# Patient Record
Sex: Female | Born: 1965 | Race: Black or African American | Hispanic: No | Marital: Single | Smoking: Current every day smoker
Health system: Southern US, Community
[De-identification: ages and names within clinical notes are randomized; demographics above are authoritative.]

## PROBLEM LIST (undated history)

## (undated) DIAGNOSIS — X838XXA Intentional self-harm by other specified means, initial encounter: Secondary | ICD-10-CM

## (undated) DIAGNOSIS — S069X9A Unspecified intracranial injury with loss of consciousness of unspecified duration, initial encounter: Secondary | ICD-10-CM

## (undated) DIAGNOSIS — F1910 Other psychoactive substance abuse, uncomplicated: Secondary | ICD-10-CM

## (undated) DIAGNOSIS — Z21 Asymptomatic human immunodeficiency virus [HIV] infection status: Secondary | ICD-10-CM

## (undated) DIAGNOSIS — F319 Bipolar disorder, unspecified: Secondary | ICD-10-CM

## (undated) DIAGNOSIS — S069XAA Unspecified intracranial injury with loss of consciousness status unknown, initial encounter: Secondary | ICD-10-CM

## (undated) DIAGNOSIS — B20 Human immunodeficiency virus [HIV] disease: Secondary | ICD-10-CM

## (undated) DIAGNOSIS — I10 Essential (primary) hypertension: Secondary | ICD-10-CM

## (undated) HISTORY — PX: OTHER SURGICAL HISTORY: SHX169

## (undated) HISTORY — PX: ARM AMPUTATION AT ELBOW: SHX550

---

## 2004-03-02 ENCOUNTER — Inpatient Hospital Stay (HOSPITAL_COMMUNITY): Admission: AD | Admit: 2004-03-02 | Discharge: 2004-03-14 | Payer: Self-pay | Admitting: Psychiatry

## 2004-03-02 ENCOUNTER — Ambulatory Visit: Payer: Self-pay | Admitting: Psychiatry

## 2004-06-16 ENCOUNTER — Ambulatory Visit: Payer: Self-pay | Admitting: Psychiatry

## 2004-06-16 ENCOUNTER — Inpatient Hospital Stay (HOSPITAL_COMMUNITY): Admission: RE | Admit: 2004-06-16 | Discharge: 2004-06-23 | Payer: Self-pay | Admitting: Psychiatry

## 2005-08-29 ENCOUNTER — Ambulatory Visit: Payer: Self-pay | Admitting: Psychiatry

## 2005-08-29 ENCOUNTER — Inpatient Hospital Stay (HOSPITAL_COMMUNITY): Admission: AD | Admit: 2005-08-29 | Discharge: 2005-09-09 | Payer: Self-pay | Admitting: Psychiatry

## 2005-10-09 ENCOUNTER — Emergency Department (HOSPITAL_COMMUNITY): Admission: EM | Admit: 2005-10-09 | Discharge: 2005-10-09 | Payer: Self-pay | Admitting: *Deleted

## 2005-10-28 ENCOUNTER — Ambulatory Visit: Payer: Self-pay | Admitting: *Deleted

## 2005-10-28 ENCOUNTER — Inpatient Hospital Stay (HOSPITAL_COMMUNITY): Admission: AD | Admit: 2005-10-28 | Discharge: 2005-11-01 | Payer: Self-pay | Admitting: *Deleted

## 2006-05-06 ENCOUNTER — Ambulatory Visit: Payer: Self-pay | Admitting: Internal Medicine

## 2006-05-06 ENCOUNTER — Encounter (INDEPENDENT_AMBULATORY_CARE_PROVIDER_SITE_OTHER): Payer: Self-pay | Admitting: *Deleted

## 2006-05-06 ENCOUNTER — Encounter: Admission: RE | Admit: 2006-05-06 | Discharge: 2006-05-06 | Payer: Self-pay | Admitting: Internal Medicine

## 2006-05-06 DIAGNOSIS — F3189 Other bipolar disorder: Secondary | ICD-10-CM | POA: Insufficient documentation

## 2006-05-06 DIAGNOSIS — F258 Other schizoaffective disorders: Secondary | ICD-10-CM

## 2006-05-06 DIAGNOSIS — IMO0002 Reserved for concepts with insufficient information to code with codable children: Secondary | ICD-10-CM | POA: Insufficient documentation

## 2006-05-06 DIAGNOSIS — B029 Zoster without complications: Secondary | ICD-10-CM | POA: Insufficient documentation

## 2006-05-06 DIAGNOSIS — E119 Type 2 diabetes mellitus without complications: Secondary | ICD-10-CM | POA: Insufficient documentation

## 2006-05-06 DIAGNOSIS — B20 Human immunodeficiency virus [HIV] disease: Secondary | ICD-10-CM | POA: Insufficient documentation

## 2006-05-06 LAB — CONVERTED CEMR LAB
ALT: 16 units/L (ref 0–35)
AST: 20 units/L (ref 0–37)
Alkaline Phosphatase: 79 units/L (ref 39–117)
BUN: 8 mg/dL (ref 6–23)
Basophils Absolute: 0 10*3/uL (ref 0.0–0.1)
Basophils Relative: 0 % (ref 0–1)
Bilirubin Urine: NEGATIVE
CD4 T Cell Abs: 1250
Chloride: 101 meq/L (ref 96–112)
Creatinine, Ser: 0.87 mg/dL (ref 0.40–1.20)
Eosinophils Absolute: 0.2 10*3/uL (ref 0.0–0.7)
Eosinophils Relative: 2 % (ref 0–5)
GC Probe Amp, Urine: NEGATIVE
HCT: 40.7 % (ref 36.0–46.0)
HCV Ab: NEGATIVE
HCV Ab: NEGATIVE
HIV 1 RNA Quant: 1330 copies/mL — ABNORMAL HIGH (ref <50)
HIV-1 antibody: POSITIVE — AB
HIV-2 Ab: UNDETERMINED — AB
HIV: REACTIVE
Hep B S Ab: POSITIVE
Hep B S Ab: POSITIVE — AB
Lymphocytes Relative: 46 % (ref 12–46)
MCHC: 33.7 g/dL (ref 30.0–36.0)
Monocytes Absolute: 0.6 10*3/uL (ref 0.2–0.7)
Nitrite: NEGATIVE
Platelets: 362 10*3/uL (ref 150–400)
RDW: 14.7 % — ABNORMAL HIGH (ref 11.5–14.0)
Specific Gravity, Urine: 1.028 (ref 1.005–1.03)
Total Bilirubin: 0.5 mg/dL (ref 0.3–1.2)
Urobilinogen, UA: 0.2 (ref 0.0–1.0)

## 2006-05-24 ENCOUNTER — Telehealth: Payer: Self-pay | Admitting: Internal Medicine

## 2006-05-25 ENCOUNTER — Telehealth: Payer: Self-pay | Admitting: Internal Medicine

## 2006-06-01 ENCOUNTER — Ambulatory Visit: Payer: Self-pay | Admitting: Internal Medicine

## 2006-06-01 LAB — CONVERTED CEMR LAB: Pap Smear: NORMAL

## 2006-07-12 ENCOUNTER — Emergency Department (HOSPITAL_COMMUNITY): Admission: EM | Admit: 2006-07-12 | Discharge: 2006-07-12 | Payer: Self-pay | Admitting: Emergency Medicine

## 2006-08-03 ENCOUNTER — Telehealth: Payer: Self-pay | Admitting: Internal Medicine

## 2006-08-06 ENCOUNTER — Encounter (INDEPENDENT_AMBULATORY_CARE_PROVIDER_SITE_OTHER): Payer: Self-pay | Admitting: *Deleted

## 2006-08-06 ENCOUNTER — Ambulatory Visit: Payer: Self-pay | Admitting: Internal Medicine

## 2006-08-06 DIAGNOSIS — R197 Diarrhea, unspecified: Secondary | ICD-10-CM

## 2006-08-06 DIAGNOSIS — M79609 Pain in unspecified limb: Secondary | ICD-10-CM

## 2006-08-06 LAB — CONVERTED CEMR LAB: Blood Glucose, Fingerstick: 180

## 2006-08-10 ENCOUNTER — Encounter: Admission: RE | Admit: 2006-08-10 | Discharge: 2006-08-10 | Payer: Self-pay | Admitting: Internal Medicine

## 2006-08-10 ENCOUNTER — Ambulatory Visit: Payer: Self-pay | Admitting: Internal Medicine

## 2006-08-10 LAB — CONVERTED CEMR LAB
ALT: 13 units/L (ref 0–35)
AST: 14 units/L (ref 0–37)
Albumin: 3.9 g/dL (ref 3.5–5.2)
Basophils Absolute: 0 10*3/uL (ref 0.0–0.1)
Basophils Relative: 0 % (ref 0–1)
CO2: 25 meq/L (ref 19–32)
Calcium: 9.2 mg/dL (ref 8.4–10.5)
Chloride: 103 meq/L (ref 96–112)
HIV 1 RNA Quant: 3330 copies/mL — ABNORMAL HIGH (ref <50)
Hemoglobin: 12.3 g/dL (ref 12.0–15.0)
Lymphocytes Relative: 45 % (ref 12–46)
MCHC: 33.2 g/dL (ref 30.0–36.0)
Monocytes Relative: 8 % (ref 3–11)
Neutro Abs: 2.8 10*3/uL (ref 1.7–7.7)
Neutrophils Relative %: 43 % (ref 43–77)
Potassium: 4.2 meq/L (ref 3.5–5.3)
RBC: 4.42 M/uL (ref 3.87–5.11)
RDW: 14.3 % — ABNORMAL HIGH (ref 11.5–14.0)
Total Protein: 6.9 g/dL (ref 6.0–8.3)

## 2006-08-11 ENCOUNTER — Encounter: Payer: Self-pay | Admitting: Internal Medicine

## 2006-08-23 ENCOUNTER — Encounter: Payer: Self-pay | Admitting: Internal Medicine

## 2006-08-24 ENCOUNTER — Ambulatory Visit: Payer: Self-pay | Admitting: Internal Medicine

## 2006-08-24 DIAGNOSIS — N951 Menopausal and female climacteric states: Secondary | ICD-10-CM | POA: Insufficient documentation

## 2006-08-31 ENCOUNTER — Telehealth: Payer: Self-pay

## 2006-09-06 ENCOUNTER — Encounter: Admission: RE | Admit: 2006-09-06 | Discharge: 2006-12-05 | Payer: Self-pay | Admitting: Internal Medicine

## 2006-09-06 ENCOUNTER — Encounter: Payer: Self-pay | Admitting: Internal Medicine

## 2006-10-14 ENCOUNTER — Other Ambulatory Visit: Payer: Self-pay

## 2006-10-14 ENCOUNTER — Other Ambulatory Visit: Payer: Self-pay | Admitting: Emergency Medicine

## 2006-10-14 ENCOUNTER — Inpatient Hospital Stay (HOSPITAL_COMMUNITY): Admission: AD | Admit: 2006-10-14 | Discharge: 2006-10-18 | Payer: Self-pay | Admitting: Psychiatry

## 2006-10-14 ENCOUNTER — Ambulatory Visit: Payer: Self-pay | Admitting: Psychiatry

## 2006-10-26 ENCOUNTER — Emergency Department (HOSPITAL_COMMUNITY): Admission: EM | Admit: 2006-10-26 | Discharge: 2006-10-26 | Payer: Self-pay | Admitting: Emergency Medicine

## 2006-11-09 ENCOUNTER — Encounter: Payer: Self-pay | Admitting: Internal Medicine

## 2006-11-23 ENCOUNTER — Emergency Department (HOSPITAL_COMMUNITY): Admission: EM | Admit: 2006-11-23 | Discharge: 2006-11-23 | Payer: Self-pay | Admitting: Emergency Medicine

## 2006-11-30 ENCOUNTER — Emergency Department (HOSPITAL_COMMUNITY): Admission: EM | Admit: 2006-11-30 | Discharge: 2006-11-30 | Payer: Self-pay | Admitting: Family Medicine

## 2006-12-05 ENCOUNTER — Inpatient Hospital Stay (HOSPITAL_COMMUNITY): Admission: EM | Admit: 2006-12-05 | Discharge: 2006-12-08 | Payer: Self-pay | Admitting: Emergency Medicine

## 2006-12-08 ENCOUNTER — Inpatient Hospital Stay (HOSPITAL_COMMUNITY): Admission: RE | Admit: 2006-12-08 | Discharge: 2006-12-14 | Payer: Self-pay | Admitting: Psychiatry

## 2006-12-08 ENCOUNTER — Ambulatory Visit: Payer: Self-pay | Admitting: Psychiatry

## 2007-01-11 ENCOUNTER — Encounter (INDEPENDENT_AMBULATORY_CARE_PROVIDER_SITE_OTHER): Payer: Self-pay | Admitting: *Deleted

## 2007-01-24 ENCOUNTER — Encounter: Payer: Self-pay | Admitting: Internal Medicine

## 2007-01-25 ENCOUNTER — Telehealth: Payer: Self-pay | Admitting: Internal Medicine

## 2007-01-26 ENCOUNTER — Encounter (INDEPENDENT_AMBULATORY_CARE_PROVIDER_SITE_OTHER): Payer: Self-pay | Admitting: *Deleted

## 2007-02-03 ENCOUNTER — Ambulatory Visit: Payer: Self-pay | Admitting: Internal Medicine

## 2007-02-03 ENCOUNTER — Encounter: Admission: RE | Admit: 2007-02-03 | Discharge: 2007-02-03 | Payer: Self-pay | Admitting: Internal Medicine

## 2007-02-03 LAB — CONVERTED CEMR LAB
ALT: 15 units/L (ref 0–35)
Alkaline Phosphatase: 92 units/L (ref 39–117)
Basophils Absolute: 0 10*3/uL (ref 0.0–0.1)
CO2: 21 meq/L (ref 19–32)
Eosinophils Relative: 2 % (ref 0–5)
HCT: 39.7 % (ref 36.0–46.0)
Lymphocytes Relative: 49 % — ABNORMAL HIGH (ref 12–46)
Platelets: 359 10*3/uL (ref 150–400)
RDW: 15.7 % — ABNORMAL HIGH (ref 11.5–15.5)
Sodium: 138 meq/L (ref 135–145)
Total Bilirubin: 0.3 mg/dL (ref 0.3–1.2)
Total Protein: 7.4 g/dL (ref 6.0–8.3)

## 2007-02-18 ENCOUNTER — Ambulatory Visit: Payer: Self-pay | Admitting: Internal Medicine

## 2007-04-26 ENCOUNTER — Telehealth: Payer: Self-pay | Admitting: Internal Medicine

## 2007-05-27 ENCOUNTER — Ambulatory Visit: Payer: Self-pay | Admitting: Internal Medicine

## 2007-05-27 ENCOUNTER — Encounter: Admission: RE | Admit: 2007-05-27 | Discharge: 2007-05-27 | Payer: Self-pay | Admitting: Internal Medicine

## 2007-05-27 LAB — CONVERTED CEMR LAB
AST: 15 units/L (ref 0–37)
Alkaline Phosphatase: 89 units/L (ref 39–117)
BUN: 12 mg/dL (ref 6–23)
Basophils Relative: 0 % (ref 0–1)
Creatinine, Ser: 0.96 mg/dL (ref 0.40–1.20)
Eosinophils Absolute: 0.2 10*3/uL (ref 0.0–0.7)
Eosinophils Relative: 2 % (ref 0–5)
HCT: 38 % (ref 36.0–46.0)
Hemoglobin: 13.2 g/dL (ref 12.0–15.0)
MCHC: 34.7 g/dL (ref 30.0–36.0)
MCV: 79.8 fL (ref 78.0–100.0)
Monocytes Absolute: 0.7 10*3/uL (ref 0.1–1.0)
Monocytes Relative: 8 % (ref 3–12)
RBC: 4.76 M/uL (ref 3.87–5.11)

## 2007-06-08 ENCOUNTER — Ambulatory Visit: Payer: Self-pay | Admitting: Internal Medicine

## 2007-06-08 DIAGNOSIS — R1115 Cyclical vomiting syndrome unrelated to migraine: Secondary | ICD-10-CM | POA: Insufficient documentation

## 2007-07-04 ENCOUNTER — Telehealth (INDEPENDENT_AMBULATORY_CARE_PROVIDER_SITE_OTHER): Payer: Self-pay | Admitting: *Deleted

## 2007-07-04 ENCOUNTER — Encounter: Payer: Self-pay | Admitting: Internal Medicine

## 2007-07-22 ENCOUNTER — Emergency Department (HOSPITAL_COMMUNITY): Admission: EM | Admit: 2007-07-22 | Discharge: 2007-07-23 | Payer: Self-pay | Admitting: Emergency Medicine

## 2007-10-25 ENCOUNTER — Encounter: Payer: Self-pay | Admitting: Internal Medicine

## 2007-10-25 ENCOUNTER — Ambulatory Visit: Payer: Self-pay | Admitting: Internal Medicine

## 2007-10-25 LAB — CONVERTED CEMR LAB
Albumin: 3.5 g/dL (ref 3.5–5.2)
Alkaline Phosphatase: 104 units/L (ref 39–117)
BUN: 7 mg/dL (ref 6–23)
CO2: 23 meq/L (ref 19–32)
Calcium: 9 mg/dL (ref 8.4–10.5)
Eosinophils Absolute: 0.2 10*3/uL (ref 0.0–0.7)
Glucose, Bld: 155 mg/dL — ABNORMAL HIGH (ref 70–99)
HCT: 37.4 % (ref 36.0–46.0)
HIV 1 RNA Quant: 2080 copies/mL — ABNORMAL HIGH (ref <50)
HIV-1 RNA Quant, Log: 3.32 — ABNORMAL HIGH (ref <1.70)
Lymphocytes Relative: 37 % (ref 12–46)
Lymphs Abs: 3.7 10*3/uL (ref 0.7–4.0)
MCV: 79.1 fL (ref 78.0–100.0)
Monocytes Relative: 4 % (ref 3–12)
Neutrophils Relative %: 57 % (ref 43–77)
Potassium: 3.7 meq/L (ref 3.5–5.3)
RBC: 4.73 M/uL (ref 3.87–5.11)
WBC: 10.4 10*3/uL (ref 4.0–10.5)

## 2007-11-04 ENCOUNTER — Ambulatory Visit (HOSPITAL_COMMUNITY): Admission: RE | Admit: 2007-11-04 | Discharge: 2007-11-04 | Payer: Self-pay | Admitting: Internal Medicine

## 2007-11-09 ENCOUNTER — Ambulatory Visit: Payer: Self-pay | Admitting: Internal Medicine

## 2007-11-09 DIAGNOSIS — S335XXA Sprain of ligaments of lumbar spine, initial encounter: Secondary | ICD-10-CM

## 2008-02-14 ENCOUNTER — Encounter: Payer: Self-pay | Admitting: Internal Medicine

## 2008-05-02 ENCOUNTER — Ambulatory Visit: Payer: Self-pay | Admitting: Internal Medicine

## 2008-05-02 LAB — CONVERTED CEMR LAB
ALT: 16 units/L (ref 0–35)
AST: 15 units/L (ref 0–37)
Alkaline Phosphatase: 107 units/L (ref 39–117)
Basophils Absolute: 0 10*3/uL (ref 0.0–0.1)
Basophils Relative: 0 % (ref 0–1)
CO2: 24 meq/L (ref 19–32)
Cholesterol: 186 mg/dL (ref 0–200)
Creatinine, Ser: 0.78 mg/dL (ref 0.40–1.20)
Eosinophils Relative: 3 % (ref 0–5)
HCT: 35.4 % — ABNORMAL LOW (ref 36.0–46.0)
HIV-1 RNA Quant, Log: 3.08 — ABNORMAL HIGH (ref <1.68)
Hemoglobin: 11.9 g/dL — ABNORMAL LOW (ref 12.0–15.0)
LDL Cholesterol: 113 mg/dL — ABNORMAL HIGH (ref 0–99)
MCHC: 33.6 g/dL (ref 30.0–36.0)
MCV: 78 fL (ref 78.0–100.0)
Monocytes Absolute: 0.6 10*3/uL (ref 0.1–1.0)
Monocytes Relative: 9 % (ref 3–12)
RBC: 4.54 M/uL (ref 3.87–5.11)
RDW: 16.8 % — ABNORMAL HIGH (ref 11.5–15.5)
Sodium: 138 meq/L (ref 135–145)
Total Bilirubin: 0.3 mg/dL (ref 0.3–1.2)
Total CHOL/HDL Ratio: 3.2
Total Protein: 7 g/dL (ref 6.0–8.3)
VLDL: 15 mg/dL (ref 0–40)

## 2008-05-18 ENCOUNTER — Ambulatory Visit: Payer: Self-pay | Admitting: Internal Medicine

## 2008-05-18 DIAGNOSIS — I10 Essential (primary) hypertension: Secondary | ICD-10-CM

## 2008-06-01 ENCOUNTER — Ambulatory Visit: Payer: Self-pay | Admitting: Internal Medicine

## 2008-06-01 ENCOUNTER — Encounter: Payer: Self-pay | Admitting: Internal Medicine

## 2008-06-03 ENCOUNTER — Emergency Department (HOSPITAL_COMMUNITY): Admission: EM | Admit: 2008-06-03 | Discharge: 2008-06-03 | Payer: Self-pay | Admitting: Emergency Medicine

## 2008-06-06 ENCOUNTER — Encounter: Payer: Self-pay | Admitting: Internal Medicine

## 2008-10-08 ENCOUNTER — Telehealth: Payer: Self-pay | Admitting: Internal Medicine

## 2008-10-09 ENCOUNTER — Ambulatory Visit: Payer: Self-pay | Admitting: Internal Medicine

## 2008-10-09 DIAGNOSIS — N898 Other specified noninflammatory disorders of vagina: Secondary | ICD-10-CM | POA: Insufficient documentation

## 2008-10-09 LAB — CONVERTED CEMR LAB
ALT: 20 units/L (ref 0–35)
Alkaline Phosphatase: 102 units/L (ref 39–117)
Basophils Relative: 0 % (ref 0–1)
CO2: 18 meq/L — ABNORMAL LOW (ref 19–32)
Creatinine, Ser: 0.86 mg/dL (ref 0.40–1.20)
Eosinophils Absolute: 0.2 10*3/uL (ref 0.0–0.7)
Eosinophils Relative: 2 % (ref 0–5)
Glucose, Bld: 116 mg/dL — ABNORMAL HIGH (ref 70–99)
HCT: 37.5 % (ref 36.0–46.0)
Lymphs Abs: 4.5 10*3/uL — ABNORMAL HIGH (ref 0.7–4.0)
MCHC: 33.9 g/dL (ref 30.0–36.0)
MCV: 75.6 fL — ABNORMAL LOW (ref >=78.0)
Monocytes Absolute: 1 10*3/uL (ref 0.1–1.0)
Monocytes Relative: 9 % (ref 3–12)
RBC: 4.96 M/uL (ref 3.87–5.11)
Sodium: 136 meq/L (ref 135–145)
Total Bilirubin: 0.4 mg/dL (ref 0.3–1.2)
Total Protein: 7.3 g/dL (ref 6.0–8.3)
WBC: 11.3 10*3/uL — ABNORMAL HIGH (ref 4.0–10.5)

## 2008-10-11 LAB — CONVERTED CEMR LAB
GC Probe Amp, Urine: NEGATIVE
Gardnerella vaginalis: POSITIVE — AB
Hemoglobin, Urine: NEGATIVE
Leukocytes, UA: NEGATIVE
Nitrite: NEGATIVE
Specific Gravity, Urine: 1.041 — ABNORMAL HIGH (ref 1.005–1.0)
Urine Glucose: NEGATIVE mg/dL
WBC, UA: NONE SEEN cells/hpf (ref <3)
pH: 6 (ref 5.0–8.0)

## 2008-10-23 ENCOUNTER — Ambulatory Visit: Payer: Self-pay | Admitting: Internal Medicine

## 2009-01-14 ENCOUNTER — Emergency Department (HOSPITAL_COMMUNITY): Admission: EM | Admit: 2009-01-14 | Discharge: 2009-01-14 | Payer: Self-pay | Admitting: Emergency Medicine

## 2009-01-18 ENCOUNTER — Emergency Department (HOSPITAL_COMMUNITY): Admission: EM | Admit: 2009-01-18 | Discharge: 2009-01-18 | Payer: Self-pay | Admitting: Emergency Medicine

## 2009-01-23 ENCOUNTER — Telehealth (INDEPENDENT_AMBULATORY_CARE_PROVIDER_SITE_OTHER): Payer: Self-pay | Admitting: *Deleted

## 2009-01-23 ENCOUNTER — Encounter: Payer: Self-pay | Admitting: Infectious Disease

## 2009-04-08 ENCOUNTER — Ambulatory Visit: Payer: Self-pay | Admitting: Internal Medicine

## 2009-04-08 LAB — CONVERTED CEMR LAB
ALT: 21 units/L (ref 0–35)
CO2: 23 meq/L (ref 19–32)
Calcium: 9.1 mg/dL (ref 8.4–10.5)
Chloride: 103 meq/L (ref 96–112)
Creatinine, Ser: 0.83 mg/dL (ref 0.40–1.20)
Eosinophils Absolute: 0.2 10*3/uL (ref 0.0–0.7)
HIV 1 RNA Quant: 880 copies/mL — ABNORMAL HIGH (ref <48)
Hemoglobin: 12.1 g/dL (ref 12.0–15.0)
Lymphs Abs: 2.4 10*3/uL (ref 0.7–4.0)
MCV: 80.3 fL (ref >=78.0)
Monocytes Absolute: 0.4 10*3/uL (ref 0.1–1.0)
Monocytes Relative: 7 % (ref 3–12)
Neutrophils Relative %: 49 % (ref 43–77)
RBC: 4.67 M/uL (ref 3.87–5.11)
Sodium: 139 meq/L (ref 135–145)
Total Protein: 7.4 g/dL (ref 6.0–8.3)
WBC: 5.8 10*3/uL (ref 4.0–10.5)

## 2009-04-13 ENCOUNTER — Emergency Department (HOSPITAL_COMMUNITY): Admission: EM | Admit: 2009-04-13 | Discharge: 2009-04-13 | Payer: Self-pay | Admitting: Emergency Medicine

## 2009-05-01 ENCOUNTER — Encounter: Payer: Self-pay | Admitting: Internal Medicine

## 2009-05-08 ENCOUNTER — Ambulatory Visit: Payer: Self-pay | Admitting: Internal Medicine

## 2009-07-16 ENCOUNTER — Emergency Department (HOSPITAL_COMMUNITY): Admission: EM | Admit: 2009-07-16 | Discharge: 2009-07-17 | Payer: Self-pay | Admitting: Emergency Medicine

## 2009-09-24 ENCOUNTER — Encounter: Payer: Self-pay | Admitting: Internal Medicine

## 2009-10-01 ENCOUNTER — Other Ambulatory Visit: Payer: Self-pay | Admitting: Emergency Medicine

## 2009-10-01 ENCOUNTER — Ambulatory Visit: Payer: Self-pay | Admitting: Psychiatry

## 2009-10-01 ENCOUNTER — Inpatient Hospital Stay (HOSPITAL_COMMUNITY): Admission: EM | Admit: 2009-10-01 | Discharge: 2009-10-07 | Payer: Self-pay | Admitting: Psychiatry

## 2009-10-18 ENCOUNTER — Telehealth (INDEPENDENT_AMBULATORY_CARE_PROVIDER_SITE_OTHER): Payer: Self-pay | Admitting: *Deleted

## 2009-10-28 ENCOUNTER — Emergency Department (HOSPITAL_COMMUNITY): Admission: EM | Admit: 2009-10-28 | Discharge: 2009-10-29 | Payer: Self-pay | Admitting: Emergency Medicine

## 2009-10-30 ENCOUNTER — Emergency Department (HOSPITAL_COMMUNITY): Admission: EM | Admit: 2009-10-30 | Discharge: 2009-10-30 | Payer: Self-pay | Admitting: Emergency Medicine

## 2009-11-28 ENCOUNTER — Encounter: Payer: Self-pay | Admitting: Emergency Medicine

## 2009-12-03 ENCOUNTER — Inpatient Hospital Stay (HOSPITAL_COMMUNITY): Admission: EM | Admit: 2009-12-03 | Discharge: 2009-12-05 | Payer: Self-pay | Admitting: Emergency Medicine

## 2009-12-03 ENCOUNTER — Ambulatory Visit: Payer: Self-pay | Admitting: Internal Medicine

## 2009-12-04 DIAGNOSIS — F259 Schizoaffective disorder, unspecified: Secondary | ICD-10-CM

## 2009-12-05 ENCOUNTER — Inpatient Hospital Stay (HOSPITAL_COMMUNITY): Admission: EM | Admit: 2009-12-05 | Discharge: 2009-12-10 | Payer: Self-pay | Admitting: Psychiatry

## 2009-12-05 ENCOUNTER — Ambulatory Visit: Payer: Self-pay | Admitting: Psychiatry

## 2009-12-14 ENCOUNTER — Ambulatory Visit: Payer: Self-pay | Admitting: Psychiatry

## 2010-02-16 ENCOUNTER — Encounter: Payer: Self-pay | Admitting: Internal Medicine

## 2010-02-25 NOTE — Miscellaneous (Signed)
Summary: Appointment No Show  Appointment status changed to no show by [employer] on 05/01/2009 4:38 PM.  No Show Comments ---------------- 39month f/u  given red bag[dde]  Appointment Information ----------------------- Appt Type:  ID OFFICE VISIT      Date:  Wednesday, May 01, 2009      Time:  3:30 PM for 15 min   Urgency:  Routine   Made By:  Hoy Finlay Scheduler  To Visit:  [name]    Reason:  39month f/u  given red bag[dde]  Appt Comments ------------- -- 05/01/09 16:38: ([facility]) NO SHOW -- 39month f/u  given red bag[dde] -- 04/19/09 11:30: ([facility]) BOOKED -- Routine ID OFFICE VISIT at 05/01/2009 3:30 PM for 15 min 39month f/u  given red bag[dde]

## 2010-02-25 NOTE — Progress Notes (Signed)
Summary: PPD  Phone Note Outgoing Call   Call placed by: Annice Pih Summary of Call: Pt. needs PPD at next office visit Initial call taken by: Wendall Mola CMA Duncan Dull),  October 18, 2009 12:46 PM

## 2010-02-25 NOTE — Assessment & Plan Note (Signed)
Summary: f/u labs/tkk   CC:  follow-up visit, lab results, B/P elevated, and Depression.  History of Present Illness: Pt doing well.  No no new complaints.  Depression History:      The patient is having a depressed mood most of the day and has a diminished interest in her usual daily activities.        The patient denies that she feels like life is not worth living, denies that she wishes that she were dead, and denies that she has thought about ending her life.        Preventive Screening-Counseling & Management  Alcohol-Tobacco     Alcohol drinks/day: 2     Alcohol type: beer     Smoking Status: current     Smoking Cessation Counseling: yes     Packs/Day: 5-6 cigs/day     Passive Smoke Exposure: no  Caffeine-Diet-Exercise     Caffeine use/day: 0     Does Patient Exercise: yes     Type of exercise: sit ups, walking     Exercise (avg: min/session): 30-60     Times/week: 6  Hep-HIV-STD-Contraception     HIV Risk: no  Safety-Violence-Falls     Seat Belt Use: yes      Drug Use:  yes.     Updated Prior Medication List: SEROQUEL  TABS (QUETIAPINE FUMARATE TABS) dose and strength unknown RISPERDAL  TABS (RISPERIDONE TABS) dose and strength unknown LANTUS 100 UNIT/ML SOLN (INSULIN GLARGINE) 20 units every night VISTARIL 25 MG CAPS (HYDROXYZINE PAMOATE)  LEXAPRO  TABS (ESCITALOPRAM OXALATE TABS)  DEPAKOTE  TBEC (DIVALPROEX SODIUM TBEC)  IBUPROFEN 800 MG  TABS (IBUPROFEN) Take 1 tablet by mouth every 8 hours as needed KENALOG   AERS (TRIAMCINOLONE ACETONIDE) apply two times a day PREMPRO 0.3-1.5 MG  TABS (CONJ ESTROG-MEDROXYPROGEST ACE) Take 1 tablet by mouth once a day REGLAN 5 MG  TABS (METOCLOPRAMIDE HCL) One tablet before meals FLEXERIL 10 MG TABS (CYCLOBENZAPRINE HCL) Take 1 tablet by mouth every 8 hours prn FLAGYL 500 MG TABS (METRONIDAZOLE) two times a day for 7 days CLONIDINE HCL 0.1 MG TABS (CLONIDINE HCL) Take 1 tablet by mouth two times a day  Current  Allergies (reviewed today): No known allergies  Past History:  Past Medical History: Last updated: 06/01/2006 HIV disease Diabetes mellitus, type I  Review of Systems  The patient denies anorexia, fever, weight loss, and abdominal pain.    Vital Signs:  Patient profile:   45 year old female Menstrual status:  regular Height:      64 inches (162.56 cm) Weight:      218.8 pounds (99.45 kg) BMI:     37.69 Temp:     98.5 degrees F (36.94 degrees C) oral Pulse rate:   96 / minute BP sitting:   136 / 103  (right arm)  Vitals Entered By: Wendall Mola CMA Duncan Dull) (May 08, 2009 1:57 PM) CC: follow-up visit, lab results, B/P elevated, Depression Is Patient Diabetic? Yes Did you bring your meter with you today? No Pain Assessment Patient in pain? yes     Location: mouth Intensity: 3 Type: throbbing Onset of pain  Intermittent Nutritional Status BMI of > 30 = obese Nutritional Status Detail appetite "good"  Does patient need assistance? Functional Status Self care Ambulation Normal Comments pt. states has been off B/P meds for one month, has refill just needs copay   Physical Exam  General:  alert, well-developed, well-nourished, and well-hydrated.   Head:  normocephalic and atraumatic.   Lungs:  normal breath sounds.      Impression & Recommendations:  Problem # 1:  HIV DISEASE (ICD-042) Pt currently asymptomatic and not on therapy. Will have her f/u in 6 months. Diagnostics Reviewed:  HIV: HIV positive - not AIDS (06/01/2008)   HIV-Western blot: Positive (05/06/2006)   CD4: 1060 (04/09/2009)   WBC: 5.8 (04/08/2009)   Hgb: 12.1 (04/08/2009)   HCT: 37.5 (04/08/2009)   Platelets: 387 (04/08/2009) HIV-1 RNA: 880 (04/08/2009)   HBSAg: NEG (05/06/2006)  Problem # 2:  ESSENTIAL HYPERTENSION, BENIGN (ICD-401.1) Pt out of clonidine will pick it up when she leaves here. Her updated medication list for this problem includes:    Clonidine Hcl 0.1 Mg Tabs  (Clonidine hcl) .Marland Kitchen... Take 1 tablet by mouth two times a day  Medications Added to Medication List This Visit: 1)  Clonidine Hcl 0.1 Mg Tabs (Clonidine hcl) .... Take 1 tablet by mouth two times a day  Other Orders: Est. Patient Level III (10272) Future Orders: T-CD4SP ([facility]) (CD4SP) ... 11/04/2009 T-HIV Viral Load [phone]) ... 11/04/2009 T-Comprehensive Metabolic Panel [phone]) ... 11/04/2009 T-CBC w/Diff ([id number]) ... 11/04/2009 T-Lipid Profile [phone]) ... 11/04/2009  Patient Instructions: 1)  Please schedule a follow-up appointment in 6 months, 2 weeks after labs.

## 2010-02-25 NOTE — Letter (Signed)
Summary: [facility]  [address]   [place], [place] [address]   Phone: [phone]  Fax: [phone]            September 24, 2009  [name] [address] [place], [place]  [address]  Dear Ms. [name],  I have tried to contact you by phone but the number the Center has on file has been disconnected.  Please call the Center for your follow-up appointments for labs and to see Dr. Philipp Deputy.  It is also time to schedule your annual PAP smear.  Please call the Center at the number above to schedule these important appointments.  Thank you.  Sincerely,    Jennet Maduro RN

## 2010-04-08 LAB — CBC
HCT: 29.2 % — ABNORMAL LOW (ref 36.0–46.0)
Hemoglobin: 9.6 g/dL — ABNORMAL LOW (ref 12.0–15.0)
MCH: 26.3 pg (ref 26.0–34.0)
MCH: 26.4 pg (ref 26.0–34.0)
MCH: 26.4 pg (ref 26.0–34.0)
MCH: 25.4 pg — ABNORMAL LOW (ref 26.0–34.0)
MCHC: 33.4 g/dL (ref 30.0–36.0)
MCHC: 33 g/dL (ref 30.0–36.0)
MCV: 79 fL (ref 78.0–100.0)
MCV: 79.8 fL (ref 78.0–100.0)
MCV: 76.9 fL — ABNORMAL LOW (ref 78.0–100.0)
Platelets: 277 10*3/uL (ref 150–400)
Platelets: 292 10*3/uL (ref 150–400)
Platelets: 332 10*3/uL (ref 150–400)
Platelets: 347 10*3/uL (ref 150–400)
RBC: 3.66 MIL/uL — ABNORMAL LOW (ref 3.87–5.11)
RBC: 3.86 MIL/uL — ABNORMAL LOW (ref 3.87–5.11)
RDW: 19.4 % — ABNORMAL HIGH (ref 11.5–15.5)
WBC: 11.1 10*3/uL — ABNORMAL HIGH (ref 4.0–10.5)

## 2010-04-08 LAB — BLOOD GAS, ARTERIAL
Acid-base deficit: 4.9 mmol/L — ABNORMAL HIGH (ref 0.0–2.0)
Drawn by: 317871
Drawn by: 326301
FIO2: 1 %
MECHVT: 0.46 mL
O2 Saturation: 98.4 %
PEEP: 5 cmH2O
RATE: 16 resp/min
RATE: 16 resp/min
pCO2 arterial: 40.5 mmHg (ref 35.0–45.0)
pO2, Arterial: 105 mmHg — ABNORMAL HIGH (ref 80.0–100.0)
pO2, Arterial: 208 mmHg — ABNORMAL HIGH (ref 80.0–100.0)

## 2010-04-08 LAB — URINALYSIS, ROUTINE W REFLEX MICROSCOPIC
Bilirubin Urine: NEGATIVE
Hgb urine dipstick: NEGATIVE
Ketones, ur: NEGATIVE mg/dL
Nitrite: NEGATIVE
Protein, ur: NEGATIVE mg/dL
Specific Gravity, Urine: 1.029 (ref 1.005–1.030)
Urobilinogen, UA: 0.2 mg/dL (ref 0.0–1.0)
Urobilinogen, UA: 1 mg/dL (ref 0.0–1.0)
pH: 6 (ref 5.0–8.0)

## 2010-04-08 LAB — POCT CARDIAC MARKERS
CKMB, poc: 1 ng/mL (ref 1.0–8.0)
CKMB, poc: <1 ng/mL — ABNORMAL LOW (ref 1.0–8.0)
Myoglobin, poc: 62.3 ng/mL (ref 12–200)
Myoglobin, poc: 44.8 ng/mL (ref 12–200)
Troponin i, poc: <0.05 ng/mL (ref 0.00–0.09)
Troponin i, poc: <0.05 ng/mL (ref 0.00–0.09)

## 2010-04-08 LAB — URINE CULTURE: Culture: NO GROWTH

## 2010-04-08 LAB — RAPID URINE DRUG SCREEN, HOSP PERFORMED
Amphetamines: NOT DETECTED
Barbiturates: NOT DETECTED
Cocaine: POSITIVE — AB
Opiates: NOT DETECTED
Tetrahydrocannabinol: POSITIVE — AB

## 2010-04-08 LAB — COMPREHENSIVE METABOLIC PANEL
AST: 26 U/L (ref 0–37)
Albumin: 3.3 g/dL — ABNORMAL LOW (ref 3.5–5.2)
Albumin: 3.5 g/dL (ref 3.5–5.2)
Alkaline Phosphatase: 76 U/L (ref 39–117)
BUN: 12 mg/dL (ref 6–23)
BUN: 7 mg/dL (ref 6–23)
BUN: 6 mg/dL (ref 6–23)
CO2: 25 mEq/L (ref 19–32)
CO2: 28 mEq/L (ref 19–32)
Calcium: 8.2 mg/dL — ABNORMAL LOW (ref 8.4–10.5)
Calcium: 8.8 mg/dL (ref 8.4–10.5)
Chloride: 110 mEq/L (ref 96–112)
Creatinine, Ser: 0.88 mg/dL (ref 0.4–1.2)
Creatinine, Ser: 1.05 mg/dL (ref 0.4–1.2)
Creatinine, Ser: 0.95 mg/dL (ref 0.4–1.2)
GFR calc Af Amer: >60 mL/min (ref >60)
GFR calc Af Amer: >60 mL/min (ref >60)
GFR calc non Af Amer: 57 mL/min — ABNORMAL LOW (ref >60)
GFR calc non Af Amer: >60 mL/min (ref >60)
Glucose, Bld: 148 mg/dL — ABNORMAL HIGH (ref 70–99)
Total Bilirubin: 0.2 mg/dL — ABNORMAL LOW (ref 0.3–1.2)
Total Bilirubin: 0.5 mg/dL (ref 0.3–1.2)
Total Bilirubin: 0.4 mg/dL (ref 0.3–1.2)
Total Protein: 6.6 g/dL (ref 6.0–8.3)

## 2010-04-08 LAB — DIFFERENTIAL
Basophils Absolute: 0.1 10*3/uL (ref 0.0–0.1)
Basophils Absolute: 0 10*3/uL (ref 0.0–0.1)
Eosinophils Relative: 1 % (ref 0–5)
Lymphocytes Relative: 40 % (ref 12–46)
Lymphocytes Relative: 33 % (ref 12–46)
Lymphs Abs: 3.4 10*3/uL (ref 0.7–4.0)
Lymphs Abs: 2.4 10*3/uL (ref 0.7–4.0)
Monocytes Absolute: 1 10*3/uL (ref 0.1–1.0)
Monocytes Relative: 11 % (ref 3–12)
Neutro Abs: 4 10*3/uL (ref 1.7–7.7)
Neutro Abs: 4 10*3/uL (ref 1.7–7.7)

## 2010-04-08 LAB — CULTURE, BLOOD (ROUTINE X 2): Culture: NO GROWTH

## 2010-04-08 LAB — MAGNESIUM: Magnesium: 1.7 mg/dL (ref 1.5–2.5)

## 2010-04-08 LAB — POCT I-STAT, CHEM 8
Chloride: 102 mEq/L (ref 96–112)
Glucose, Bld: 117 mg/dL — ABNORMAL HIGH (ref 70–99)
HCT: 42 % (ref 36.0–46.0)
Hemoglobin: 14.3 g/dL (ref 12.0–15.0)
Potassium: 4.1 mEq/L (ref 3.5–5.1)
Sodium: 138 mEq/L (ref 135–145)

## 2010-04-08 LAB — MRSA PCR SCREENING: MRSA by PCR: NEGATIVE

## 2010-04-08 LAB — TRICYCLICS SCREEN, URINE: TCA Scrn: NOT DETECTED

## 2010-04-08 LAB — CARDIAC PANEL(CRET KIN+CKTOT+MB+TROPI)
CK, MB: 1.9 ng/mL (ref 0.3–4.0)
Relative Index: INVALID (ref 0.0–2.5)
Total CK: 115 U/L (ref 7–177)

## 2010-04-08 LAB — BASIC METABOLIC PANEL
CO2: 20 mEq/L (ref 19–32)
Chloride: 111 mEq/L (ref 96–112)
GFR calc Af Amer: >60 mL/min (ref >60)
Sodium: 141 mEq/L (ref 135–145)

## 2010-04-08 LAB — T-HELPER CELLS (CD4) COUNT (NOT AT ARMC)
CD4 % Helper T Cell: 51 % (ref 33–55)
CD4 T Cell Abs: 1120 uL (ref 400–2700)

## 2010-04-08 LAB — CULTURE, RESPIRATORY W GRAM STAIN

## 2010-04-08 LAB — ACETAMINOPHEN LEVEL
Acetaminophen (Tylenol), Serum: <10 ug/mL — ABNORMAL LOW (ref 10–30)
Acetaminophen (Tylenol), Serum: <10 ug/mL — ABNORMAL LOW (ref 10–30)

## 2010-04-08 LAB — URINE MICROSCOPIC-ADD ON

## 2010-04-08 LAB — SALICYLATE LEVEL: Salicylate Lvl: <4 mg/dL (ref 2.8–20.0)

## 2010-04-08 LAB — PHOSPHORUS: Phosphorus: 4 mg/dL (ref 2.3–4.6)

## 2010-04-09 LAB — BASIC METABOLIC PANEL
CO2: 20 mEq/L (ref 19–32)
Calcium: 8.4 mg/dL (ref 8.4–10.5)
Creatinine, Ser: 0.86 mg/dL (ref 0.4–1.2)
GFR calc Af Amer: >60 mL/min (ref >60)
GFR calc non Af Amer: >60 mL/min (ref >60)
Glucose, Bld: 95 mg/dL (ref 70–99)

## 2010-04-09 LAB — ACETAMINOPHEN LEVEL: Acetaminophen (Tylenol), Serum: <10 ug/mL — ABNORMAL LOW (ref 10–30)

## 2010-04-09 LAB — DIFFERENTIAL
Basophils Absolute: 0 10*3/uL (ref 0.0–0.1)
Eosinophils Absolute: 0.1 10*3/uL (ref 0.0–0.7)
Eosinophils Relative: 2 % (ref 0–5)
Neutrophils Relative %: 58 % (ref 43–77)

## 2010-04-09 LAB — CBC
MCH: 26.3 pg (ref 26.0–34.0)
MCV: 77.9 fL — ABNORMAL LOW (ref 78.0–100.0)
Platelets: 307 10*3/uL (ref 150–400)
RDW: 21.8 % — ABNORMAL HIGH (ref 11.5–15.5)
WBC: 7.4 10*3/uL (ref 4.0–10.5)

## 2010-04-09 LAB — BRAIN NATRIURETIC PEPTIDE: Pro B Natriuretic peptide (BNP): <30 pg/mL (ref 0.0–100.0)

## 2010-04-09 LAB — TRICYCLICS SCREEN, URINE: TCA Scrn: NOT DETECTED

## 2010-04-10 LAB — COMPREHENSIVE METABOLIC PANEL
ALT: 27 U/L (ref 0–35)
Alkaline Phosphatase: 86 U/L (ref 39–117)
CO2: 26 mEq/L (ref 19–32)
Chloride: 104 mEq/L (ref 96–112)
GFR calc non Af Amer: >60 mL/min (ref >60)
Glucose, Bld: 125 mg/dL — ABNORMAL HIGH (ref 70–99)
Potassium: 3.2 mEq/L — ABNORMAL LOW (ref 3.5–5.1)
Sodium: 142 mEq/L (ref 135–145)
Total Bilirubin: 0.6 mg/dL (ref 0.3–1.2)

## 2010-04-10 LAB — RAPID URINE DRUG SCREEN, HOSP PERFORMED
Amphetamines: NOT DETECTED
Barbiturates: POSITIVE — AB
Benzodiazepines: NOT DETECTED
Benzodiazepines: NOT DETECTED
Cocaine: POSITIVE — AB
Cocaine: POSITIVE — AB
Opiates: NOT DETECTED
Tetrahydrocannabinol: NOT DETECTED

## 2010-04-10 LAB — BASIC METABOLIC PANEL
BUN: 5 mg/dL — ABNORMAL LOW (ref 6–23)
Chloride: 104 mEq/L (ref 96–112)
GFR calc non Af Amer: >60 mL/min (ref >60)
Potassium: 3.4 mEq/L — ABNORMAL LOW (ref 3.5–5.1)
Sodium: 136 mEq/L (ref 135–145)

## 2010-04-10 LAB — POCT PREGNANCY, URINE: Preg Test, Ur: NEGATIVE

## 2010-04-10 LAB — CBC
HCT: 34.4 % — ABNORMAL LOW (ref 36.0–46.0)
Hemoglobin: 11.5 g/dL — ABNORMAL LOW (ref 12.0–15.0)
MCH: 25.3 pg — ABNORMAL LOW (ref 26.0–34.0)
MCV: 77.3 fL — ABNORMAL LOW (ref 78.0–100.0)
Platelets: 323 10*3/uL (ref 150–400)
RBC: 4.45 MIL/uL (ref 3.87–5.11)
RBC: 4.47 MIL/uL (ref 3.87–5.11)
RDW: 20.9 % — ABNORMAL HIGH (ref 11.5–15.5)
WBC: 10 10*3/uL (ref 4.0–10.5)
WBC: 8.4 10*3/uL (ref 4.0–10.5)

## 2010-04-10 LAB — DIFFERENTIAL
Basophils Relative: 0 % (ref 0–1)
Eosinophils Absolute: 0.2 10*3/uL (ref 0.0–0.7)
Eosinophils Relative: 2 % (ref 0–5)
Lymphocytes Relative: 35 % (ref 12–46)
Lymphs Abs: 2.9 10*3/uL (ref 0.7–4.0)
Monocytes Absolute: 0.7 10*3/uL (ref 0.1–1.0)
Monocytes Relative: 8 % (ref 3–12)
Neutro Abs: 4.6 10*3/uL (ref 1.7–7.7)
Neutrophils Relative %: 67 % (ref 43–77)

## 2010-04-10 LAB — SALICYLATE LEVEL: Salicylate Lvl: <4 mg/dL (ref 2.8–20.0)

## 2010-04-10 LAB — HEPATIC FUNCTION PANEL
Albumin: 3.1 g/dL — ABNORMAL LOW (ref 3.5–5.2)
Alkaline Phosphatase: 72 U/L (ref 39–117)
Alkaline Phosphatase: 92 U/L (ref 39–117)
Bilirubin, Direct: <0.1 mg/dL (ref 0.0–0.3)
Total Bilirubin: 0.5 mg/dL (ref 0.3–1.2)
Total Protein: 6.2 g/dL (ref 6.0–8.3)

## 2010-04-10 LAB — URINALYSIS, ROUTINE W REFLEX MICROSCOPIC
Bilirubin Urine: NEGATIVE
Glucose, UA: NEGATIVE mg/dL
Hgb urine dipstick: NEGATIVE
Ketones, ur: NEGATIVE mg/dL
pH: 6.5 (ref 5.0–8.0)

## 2010-04-10 LAB — ETHANOL
Alcohol, Ethyl (B): 84 mg/dL — ABNORMAL HIGH (ref 0–10)
Alcohol, Ethyl (B): <5 mg/dL (ref 0–10)

## 2010-04-10 LAB — URINE MICROSCOPIC-ADD ON

## 2010-04-10 LAB — LIPID PANEL
Cholesterol: 145 mg/dL (ref 0–200)
Total CHOL/HDL Ratio: 2.8 RATIO

## 2010-04-10 LAB — TSH: TSH: 1.459 u[IU]/mL (ref 0.350–4.500)

## 2010-04-10 LAB — CARBAMAZEPINE LEVEL, TOTAL: Carbamazepine Lvl: 3.2 ug/mL — ABNORMAL LOW (ref 4.0–12.0)

## 2010-04-10 LAB — ACETAMINOPHEN LEVEL: Acetaminophen (Tylenol), Serum: <10 ug/mL — ABNORMAL LOW (ref 10–30)

## 2010-04-13 LAB — POCT I-STAT, CHEM 8
BUN: 12 mg/dL (ref 6–23)
Creatinine, Ser: 1.3 mg/dL — ABNORMAL HIGH (ref 0.4–1.2)
Potassium: 3.3 mEq/L — ABNORMAL LOW (ref 3.5–5.1)
Sodium: 135 mEq/L (ref 135–145)
TCO2: 24 mmol/L (ref 0–100)

## 2010-04-13 LAB — RAPID URINE DRUG SCREEN, HOSP PERFORMED
Barbiturates: NOT DETECTED
Benzodiazepines: POSITIVE — AB

## 2010-04-13 LAB — ETHANOL: Alcohol, Ethyl (B): <5 mg/dL (ref 0–10)

## 2010-04-13 LAB — POCT PREGNANCY, URINE: Preg Test, Ur: NEGATIVE

## 2010-04-20 LAB — T-HELPER CELL (CD4) - (RCID CLINIC ONLY)
CD4 % Helper T Cell: 45 % (ref 33–55)
CD4 T Cell Abs: 1060 uL (ref 400–2700)

## 2010-04-28 LAB — URINALYSIS, ROUTINE W REFLEX MICROSCOPIC
Bilirubin Urine: NEGATIVE
Nitrite: NEGATIVE
Protein, ur: NEGATIVE mg/dL
Specific Gravity, Urine: 1.029 (ref 1.005–1.030)
Urobilinogen, UA: 0.2 mg/dL (ref 0.0–1.0)

## 2010-04-28 LAB — URINE CULTURE

## 2010-04-28 LAB — DIFFERENTIAL
Basophils Absolute: 0.1 10*3/uL (ref 0.0–0.1)
Basophils Relative: 1 % (ref 0–1)
Lymphocytes Relative: 35 % (ref 12–46)
Neutro Abs: 5.4 10*3/uL (ref 1.7–7.7)
Neutrophils Relative %: 58 % (ref 43–77)

## 2010-04-28 LAB — WET PREP, GENITAL

## 2010-04-28 LAB — GC/CHLAMYDIA PROBE AMP, GENITAL
Chlamydia, DNA Probe: NEGATIVE
GC Probe Amp, Genital: POSITIVE — AB

## 2010-04-28 LAB — RPR: RPR Ser Ql: NONREACTIVE

## 2010-04-28 LAB — CBC
MCHC: 33.7 g/dL (ref 30.0–36.0)
Platelets: 327 10*3/uL (ref 150–400)
RDW: 16.9 % — ABNORMAL HIGH (ref 11.5–15.5)

## 2010-04-28 LAB — PREGNANCY, URINE: Preg Test, Ur: NEGATIVE

## 2010-04-28 LAB — URINE MICROSCOPIC-ADD ON

## 2010-05-02 LAB — T-HELPER CELL (CD4) - (RCID CLINIC ONLY): CD4 T Cell Abs: 1870 uL (ref 400–2700)

## 2010-05-06 LAB — POCT PREGNANCY, URINE: Preg Test, Ur: NEGATIVE

## 2010-05-07 LAB — T-HELPER CELL (CD4) - (RCID CLINIC ONLY): CD4 T Cell Abs: 1020 uL (ref 400–2700)

## 2010-05-29 ENCOUNTER — Other Ambulatory Visit: Payer: Self-pay

## 2010-06-10 NOTE — Consult Note (Signed)
[name], [name]                 ACCOUNT NO.:  [id number]   MEDICAL RECORD NO.:  [id number]          PATIENT TYPE:  INP   LOCATION:  1506                         FACILITY:  [facility]   PHYSICIAN:  Antonietta Breach, M.D.  DATE OF BIRTH:  November 18, 1965   DATE OF CONSULTATION:  12/07/2006  DATE OF DISCHARGE:  12/08/2006                                 CONSULTATION   [name] continues with auditory hallucinations that are derogatory.  She is continuing to have suicidal thoughts.  She is cooperative with  staff and medical care.  She is continuing to endorse depressed mood,  anhedonia, thoughts of hopelessness and helplessness she is not having  any adverse Zyprexa effects.   PHYSICAL EXAMINATION:  VITAL SIGNS:  Temperature 97, pulse 69,  respiratory rate 18, blood pressure 118/56, O2 saturation on room air  99%.   MENTAL STATUS EXAM:  [name] is lying in a supine position in her  hospital bed with good eye contact.  She does have constricted affect,  depressed mood.  She is oriented to all spheres.  Her memory is intact  to immediate recent and remote.  Speech is within normal limits.  Thought content:  As in the history of present illness.  Thought process  is coherent.  There are no looseness of associations.  Her judgment is  impaired for self-care, however, it is intact for the ability to consent  to psychiatric treatment.  Her insight is intact for her psychiatric  symptoms.  She is cooperative.   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   RECOMMENDATIONS:  Would check an EKG to see what her QTC currently is.  If it is less than 500 milliseconds, would increase her Zyprexa to 7.5  mg nightly for antipsychosis.   Would admit to a psychiatric unit once medically cleared.      Antonietta Breach, M.D.  Electronically Signed     JW/MEDQ  D:  12/09/2006  T:  12/10/2006  Job:  [id number]

## 2010-06-10 NOTE — H&P (Signed)
NAMEJANAISA, [name] NO.:  [id number]   MEDICAL RECORD NO.:  [id number]          PATIENT TYPE:  IPS   LOCATION:  0407                          FACILITY:  [facility]   PHYSICIAN:  Anselm Jungling, MD  DATE OF BIRTH:  1965-09-29   DATE OF ADMISSION:  12/08/2006  DATE OF DISCHARGE:                       PSYCHIATRIC ADMISSION ASSESSMENT   IDENTIFYING INFORMATION:  This is a 45 year old African American female.  This is a voluntary admission.   HISTORY OF PRESENT ILLNESS:  This patient presents after hospitalization  on the medical unit after an overdose of cocaine, alcohol and Seroquel.  She had had an argument with her mother immediately before the overdose  about her drug use.  Says that she has been having more severe problems  with her mother since she relapsed on drugs several weeks ago.  When she  uses she hears auditory hallucinations of voices telling her to kill  herself, endorsing a lot of severe mood swings with anger and paranoia  made worse by the drug use.  Says that she felt that the medications  that she has taken before for her mental illness has worked well to  control her mood swings and symptoms and says she does not know why she  cannot stay on them.  Endorsing a lot of severe cocaine cravings,  tasting it and smelling it.  Also endorsing a lot of paranoia, feeling  that others are looking at her, judging her.  Admits to even today  continuing to have agitated thoughts with some anger, feeling that she  still wants to hurt herself and wishing that she were dead.  Said that  she had been binging on alcohol and cocaine and is usually triggered by  having a habitual friend of hers who also uses comes to the house and  that is when it starts.  Her mother has been quite angry with her and  the patient would like to look for alternative housing in order to have  a change of social setting to remain abstinent.   PAST PSYCHIATRIC HISTORY:  The patient is  followed at [facility] in [facility].  This is her second admission to [facility] within the past year.  She has a history of  schizoaffective disorder and was last here in September of 2008.  Also  has a history of chronic alcohol and cocaine abuse and has had 6 months  of abstinence in the past and felt quite good during that time.  Says  her mood was stable.  In the past she has been managed on Risperdal up  to 3 mg twice a day, Seroquel 600-900 mg at bedtime and Depakote 1500 mg  daily from time to time.  Has not had Depakote in the last couple of  years.  She has a history of prior overdoses and has a history of  ischemic injury to her left arm after she lay on it for several hours  after overdosing before she was found, resulting in amputation.   SOCIAL HISTORY:  A single African  American female, never married, no  children, disabled with schizoaffective disorder.  Currently living with  her mother in [facility] and also expressing a lot of concern about her  medical condition, finding out in May of 2008 that she is HIV positive.   MEDICAL HISTORY:  The patient is followed by Dr. Yisroel Ramming at [facility] and is keeping regular appointments.  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   PAST MEDICAL HISTORY:  Has been remarkable for some evidence of glucose  intolerance in the past but no diagnosis of diabetes.   CURRENT MEDICATIONS:  She reports for mental health she was taking  Seroquel 900 mg p.o. daily at bedtime and Prozac 20 mg daily.   DRUG ALLERGIES:  LORAZEPAM, LIBRIUM which she says has caused her to  have muscle stiffness and strange mouth movements in the past.   POSITIVE PHYSICAL FINDINGS:  Full physical exam was done on medical  unit.  It is noted in the record.  This is an obese [place] American  female, afebrile, fully  alert, in no distress today.   DIAGNOSTIC STUDIES:  CBC - WBC 6.4, hemoglobin 11.4, hematocrit 34.2 and  platelets 309,000.  Chemistry was sodium 139, potassium 3.7, chloride  105, carbon dioxide 28, BUN 9, creatinine 0.97, random glucose 132.  Liver enzymes SGOT 34, SGPT 27, alkaline phosphatase 86, total bilirubin  0.8.  TSH is currently pending.  Her urine drug screen was positive for  cocaine, benzodiazepines and marijuana.   MENTAL STATUS EXAM:  A fully alert female complaining of some agitated  thoughts.  She is in a hospital gown and has been reclusive to her room,  quiet today, not participating.  She remains quite depressed, wishing  she was dead, feeling guarded with some paranoia today.  Feels others  here are looking at her.  Also complained of having some alcohol  withdrawal symptoms, feeling sweaty and shaky.  Speech is normal in  pace, tone and production, relevant, appropriate, mood depressed.  Thought process logical and coherent.  Denying any auditory  hallucinations today.  Complaining of continued suicidal thoughts,  thoughts of walking into traffic or possibly overdosing.  Cognition is  preserved.  No homicidality.   AXIS I:  Schizoaffective disorder with psychosis, polysubstance  dependence.  AXIS II:  Deferred.  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  AXIS IV:  Severe issues with domestic conflict and social relationships  and significant medical issues.  AXIS V:  Current 32, past year 69.   PLAN:  Is to voluntarily admit the patient.  She will be put on the  intensive care unit until she is feeling less guarded.  We are going to  start her on Risperdal M tab 2 mg p.o. now and daily at bedtime and will  continue the Valium detox that was started on the medical unit with 5 mg  b.i.d. for 2 days followed by 2.5 mg b.i.d. for 2 days and 2.5 mg daily  for 2 days.  We have also started her  on amantadine 100 mg b.i.d. for  the cocaine cravings and she will receive folic acid 1 mg daily and  thiamine 100 mg daily.  She is wanting to consider receiving longterm  injectable medications to deal with her agitated thoughts and mood  swings and has  done well in the past on Risperdal hence the selection of  starting her on oral Risperdal with possible conversion to Risperdal  Consta injections q. 2 weeks.  We are going to hold her Prozac for now  given her history of mood swings and will reevaluate that during her  stay here.  She is also on Cogentin 1 mg b.i.d. and 2 mg IM q.8 h p.r.n.  for EPS or akathisia.  Estimated length of stay is 5 days.      Margaret A. [name], N.P.      Anselm Jungling, MD  Electronically Signed    MAS/MEDQ  D:  12/09/2006  T:  12/10/2006  Job:  [id number]

## 2010-06-10 NOTE — H&P (Signed)
NAMEJONELLE, [name]                 ACCOUNT NO.:  [id number]   MEDICAL RECORD NO.:  [id number]          PATIENT TYPE:  INP   LOCATION:  1506                         FACILITY:  [facility]   PHYSICIAN:  Wilson Singer, M.D.DATE OF BIRTH:  [birth year]   DATE OF ADMISSION:  12/05/2006  DATE OF DISCHARGE:                              HISTORY & PHYSICAL   HISTORY:  This is a 45 year old lady who took an overdose of Seroquel,  about 10 tablets, and baclofen.  She cannot remember the dose of  Seroquel tablets; the baclofen tablets are 10 mg each.  She took this  yesterday evening together with half a gallon of liquor as well as  cocaine.  She does have a history of polysubstance drug abuse.  She also  has a history of schizophrenia, bipolar disorder and has had several  suicide attempts in the past.  She was suicidal when she took the  medications and still continues to be suicidal.  Apparently, she had an  altercation/argument with her mother who told her that she could not  live with her.  She is essentially homeless and lives in shelters.   PAST MEDICAL HISTORY:  1. Laparotomy several years ago for bowel obstruction.  2. Left below elbow amputation.  3. Recently diagnosed with HIV disease in May of 2008.  Her CD-4 count      was 950 in July of 2008 with T helper cells at 35% in the normal      range.  She is, therefore, not on any HIV medications.   MEDICATIONS:  1. Prozac 40 mg daily.  2. Baclofen 10 mg t.i.d.  3. Seroquel 900 mg every night.  4. Ibuprofen as needed.   ALLERGIES:  None.   SOCIAL HISTORY:  As mentioned above.   FAMILY HISTORY:  Noncontributory.   REVIEW OF SYSTEMS:  Apart from the symptoms mentioned above, there are  no other symptoms referable to all systems reviewed.   PHYSICAL EXAMINATION:  Temperature 97.1, blood pressure 133/79, pulse  103, saturation 95%, respiratory rate 12 to 14.  CARDIOVASCULAR:  Heart sounds are present and normal without murmurs.  RESPIRATORY:  Lung fields are clear.  ABDOMEN:  Soft, nontender with no hepatosplenomegaly.  NEUROLOGICAL:  Alert, slightly drowsy.  Smells of alcohol.  There are no  focal neurologic signs.   INVESTIGATIONS:  Urinalysis essentially negative.  Urine pregnancy test  negative.  Hemoglobin 12.1, white blood cell count 9.3, platelets 366.  Urine drug screen positive for cocaine and benzodiazepines as well as  tetrahydrocannabinoids.  Sodium 133, potassium 3.9, bicarbonate 22,  glucose 103, BUN 14, creatinine 0.88.   IMPRESSION:  1. Poly-drug overdose with suicidal ideation.  2. Alcohol abuse.  3. Schizophrenia.  4. Bipolar disorder.  5. Human immunodeficiency virus disease, stable.   PLAN:  1. Admit.  2. Alcohol withdrawal protocol.  3. Psychiatric consultation.  I suspect she will need inpatient      psychiatric care.  4. Further recommendations will depend on the patient's hospital      progress.      Nimish Normajean Glasgow,  M.D.  Electronically Signed     NCG/MEDQ  D:  12/05/2006  T:  12/06/2006  Job:  [id number]

## 2010-06-10 NOTE — Consult Note (Signed)
[name], [name]                 ACCOUNT NO.:  [id number]   MEDICAL RECORD NO.:  [id number]          PATIENT TYPE:  INP   LOCATION:  1506                         FACILITY:  [facility]   PHYSICIAN:  Antonietta Breach, M.D.  DATE OF BIRTH:  08-15-1965   DATE OF CONSULTATION:  12/06/2006  DATE OF DISCHARGE:                                 CONSULTATION   This is state due to the need to facilitate admission to another  hospital.   REFERRING PHYSICIAN:  [facility].   REASON FOR CONSULTATION:  Drug overdose, suicide attempt.   HISTORY OF PRESENT ILLNESS:  The patient is a 45 year old female  admitted to the [facility] on December 05, 2006,  after an overdose.   The patient's states that for approximately three weeks she has had  progressive drop in her mood along with decreased energy, difficulty  concentrating.  She has been experiencing command self-destructive  auditory hallucinations.  She took an overdose of Seroquel, Baclofen,  and alcohol with the intent to kill herself.  She has been continuing to  have suicidal thoughts along with self-destructive auditory  hallucinations.   The patient has been maintained on outpatient treatment with Prozac 40  mg daily.   PAST PSYCHIATRIC HISTORY:  The patient was admitted to the [facility] in May of 2006.  At that time, she was  suffering from depression and suicidal thoughts including a plan to jump  into traffic.   She also has a history of admission to [facility] in  February of 2006.  She has been [facility] the [facility].   The patient has been detoxified for substances at [facility].  She has a  history of drinking a case of beer per day at times.  She also has  smoked cocaine crack daily at times up to $400 per day.  She also has a  history of smoking marijuana.   She was hospitalized at [facility] in September of  2008.  At that time it was  noted in the past medical record that she has  a history of bipolar disorder.  She was admitted in September to the  psychiatric ward with depression and suicidal ideation with the plan to  overdose.  She had been abusing cocaine and alcohol at that time.  It  was noted that the patient was given a diagnosis of schizophrenia.  She  was discharged on Prozac 40 mg daily and Seroquel 600 mg q.h.s.   FAMILY PSYCHIATRIC HISTORY:  There is none known.   SOCIAL HISTORY:  The patient has been living with her aunt recently.  She has no children.  She has a ninth grade education.  She is  unemployed.  Please see the above substance abuse history.   PAST MEDICAL HISTORY:  Status post polysubstance overdose.  History of  laparotomy for bowel obstruction.  History of left below elbow  amputation.  The patient has a history of diagnosis with HIV disease  with CD4 count of 950 in July of  2008 and T Helper cells at 35% of the  normal range.   MEDICATIONS:  The MAR is reviewed.   ALLERGIES:  ATIVAN, LIBRIUM.   CURRENT PSYCHOTROPICS:  1. Valium 2.5 mg b.i.d.  2. Prozac 40 mg daily.  3. She is on folic acid 1 mg daily.  4. Multivitamin daily.  5. Thiamine 100 mg daily.   LABORATORY DATA:  SGOT 22, SGPT 25, WBC 6.4, hemoglobin 11.4, platelet  count 309.  TSH is 1.234 within normal limits.  Aspirin negative,  Tylenol negative.  Urine drug screen is positive for benzodiazepines,  cocaine, and tetrahydrocannabinol.  HCG negative.   REVIEW OF SYSTEMS:  CONSTITUTIONAL:  Afebrile, no weight loss.  HEAD:  No trauma.  EYES:  No visual changes.  EARS:  No hearing impairment.  NOSE:  No rhinorrhea.  MOUTH/THROAT:  No sore throat.  NEUROLOGY:  No  focal motor or sensory changes.  PSYCHIATRIC:  As above.  CARDIOVASCULAR:  No chest pain or palpitations.  RESPIRATORY:  No  coughing or wheezing.  GASTROINTESTINAL:  No nausea, vomiting, or  diarrhea.  GENITOURINARY:  No dysuria.  SKIN:  Unremarkable.   ENDOCRINE/METABOLIC:  No heat or cold intolerance.  MUSCULOSKELETAL:  As  above.  HEMATOLOGIC/LYMPHATIC:  Anemia.   PHYSICAL EXAMINATION:  VITAL SIGNS:  Temperature 98.1, pulse 96,  respiratory rate 20, blood pressure 120/61, O2 saturation on room air  95%.  GENERAL:  The patient is a middle-age female lying in a supine position  in her hospital bed.  She has no abnormal involuntary movements.   MENTAL STATUS EXAM:  The patient's eye contact is intermittent, her  attention span is mildly decreased.  She is cooperative with the  interview.  Her concentration is mildly decreased.  She is oriented to  all spheres.  Her memory is intact to immediate, recent, and remote.  Fund of knowledge and intelligence are within normal limits.  Speech  involves normal rate and prosody without dysarthria.  Thought process;  logical, coherent, goal-directed, no looseness of associations.  Language, expression, and comprehension are intact.  Thought content;  the patient is having self-destructive auditory hallucinations and  demeaning auditory hallucinations.  She is having intermittent suicidal  thoughts.  She has no delusions.  Insight is partial, judgment is  impaired, affect is anxious, mood is depressed.   ASSESSMENT:   AXIS I:  293.82 psychotic disorder not otherwise specified with  hallucinations.   Rule out 295.70 schizoaffective disorder.   Polysubstance dependence.   AXIS II:  Deferred.   AXIS III:  See general medical section above.   AXIS IV:  Primary support group general medical.   AXIS V:  1.   The patient is at risk to harm herself outside of the supportive  environment of the hospital.  She is suffering from auditory  hallucinations and depressed mood.   Therefore, I would recommend that the patient be admitted to a  psychiatric inpatient unit for further evaluation and treatment once she  is medically cleared.   The undersigned the indications, alternatives, and adverse  effects of  Zyprexa with the patient.  She wants to proceed with Zyprexa.  We will  start Zyprexa 5 mg q.h.s. for antipsychosis and acute mood  stabilization.  We will increase her Valium to 5-10 mg p.o. t.i.d. for  anxiety.  I would continue the sitter.  I would continue low stimulation  ego supportive psychotherapy to facilitate a therapeutic alliance.  I  would check a QTC.  If the QTC is greater than 500 milliseconds I would  discontinue the Zyprexa.  The patient's QTC on EKG is 478 milliseconds  at this time.      Antonietta Breach, M.D.  Electronically Signed     JW/MEDQ  D:  12/08/2006  T:  12/08/2006  Job:  [id number]
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
## 2010-06-10 NOTE — Discharge Summary (Signed)
NAMEGINEVRA, TACKER NO.:  [id number]   MEDICAL RECORD NO.:  [id number]          PATIENT TYPE:  IPS   LOCATION:  0400                          [facility]ILITY[facility]  [facility]   PHYSICIAN:  Anselm Jungling, MD  DATE OF BIRTH:  Mar 10, 1965   DATE OF ADMISSION:  10/14/2006  DATE OF DISCHARGE:  10/18/2006                               DISCHARGE SUMMARY   IDENTIFYING DATA/REASON FOR ADMISSION:  This was an inpatient  psychiatric admission for [name], a 45 year old African-American female  who came to [place] with a history of schizophrenia, and who had also been  diagnosed with bipolar disorder in the past.  She was admitted due to  increasing depression and suicidal ideation with a plan to overdose.  In  addition, she had been abusing alcohol and cocaine, and was having  auditory hallucinations.  She had been recently diagnosed with HIV, and  a close friend died.  Please refer to the admission note for further  details pertaining to the symptoms, circumstances and history that led  to her hospitalization.   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   MEDICAL/LABORATORY:  As referenced above, the patient had recently been  diagnosed HIV positive.  However, she had no acute or chronic medical  problems otherwise.  She was medically and physically assessed by the  psychiatric nurse practitioner.  There were no significant medical  issues.   HOSPITAL COURSE:  The patient was admitted to the adult inpatient  psychiatric service.  She presented as a moderately obese, but normally-  developed female who was pleasant, cooperative, and fully oriented.  She  was placed on a Librium withdrawal protocol due to her alcohol abuse and  high recent consumption.  She was also continued on Prozac, and  Seroquel.  Doses of Seroquel were adjusted during her  hospital stay.  Her detoxification was uneventful.  She remained pleasant and  cooperative throughout without any active suicidal ideation.   On the fifth hospital day, she appeared to have completed the  detoxification process, was absent any significant active symptoms of  schizophrenia or bipolar disorder and felt to be appropriate for  discharge.  She agreed to the following aftercare plan.   AFTERCARE:  The patient was to follow up at the [facility] with  their psychiatrist on October 21, 2006.   DISCHARGE MEDICATIONS:  1. Prozac 40 mg daily.  2. Seroquel 600 mg q.h.s.   DISCHARGE DIAGNOSES:  AXIS I:  Schizophrenia, chronic, not otherwise  specified.  Alcohol abuse.  Cocaine abuse.  AXIS II:  Deferred.  AXIS III:  HIV positive.  AXIS IV:  Stressors:  Severe.  AXIS V:  GAF on discharge 55.      Anselm Jungling, MD  Electronically Signed     SPB/MEDQ  D:  10/19/2006  T:  10/19/2006  Job:  [id number]

## 2010-06-12 ENCOUNTER — Ambulatory Visit: Payer: Self-pay | Admitting: Adult Health
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
## 2010-06-13 NOTE — Discharge Summary (Signed)
[name], MABLE NO.:  [id number]   MEDICAL RECORD NO.:  [id number]          PATIENT TYPE:  IPS   LOCATION:  0505                          FACILITY:  [facility]   PHYSICIAN:  Geoffery Lyons, M.D.      DATE OF BIRTH:  July 02, 1965   DATE OF ADMISSION:  06/16/2004  DATE OF DISCHARGE:  06/23/2004                                 DISCHARGE SUMMARY   CHIEF COMPLAINT AND PRESENT ILLNESS:  This was one of several admissions to  [facility] for this 45 year old single African-American  female involuntarily committed.  She endorsed that she was feeling depressed  and felt like killing herself for a week.  Complaining of decreased sleep  for a week, suicidal ideation to jump into traffic.  She has been sad,  crying daily, positive auditory hallucinations, voices telling her to hurt  herself.  Denies any visual hallucinations or homicidal ideation.  Described  some anxiety.  She called her mother with these complaints and her mother  brought her to [facility].   PAST PSYCHIATRIC HISTORY:  Last admission at [facility] in  February of 2006.  Followed by Dr. Vedia Coffer at [facility] in [place].  Previously at [facility] in [place].   ALCOHOL/DRUG HISTORY:  She began drinking alcohol at age 45, drinks daily, a  case of beer per day.  Smokes crack daily and she smokes marijuana about  every other day.   MEDICAL HISTORY:  Insulin-dependent diabetes mellitus, left below-the-knee  amputation.   MEDICATIONS:  Depakote ER 500 mg twice a day, Lantus insulin, Seroquel 200  mg at night, Imitrex every 24 hours as needed, Pepcid, Wellbutrin XL 300 mg  per day, amantadine 100 mg twice a day, metformin 500 mg in the morning and  each evening and trazodone 100 mg at night.   PHYSICAL EXAMINATION:  Performed and failed to show any acute findings.   LABORATORY DATA:  CBC with hemoglobin 11.1, hematocrit 33.3, potassium 3.2.  Liver  enzymes within normal limits.  Urine pregnancy test was negative.  Urine drug screen positive for cocaine and marijuana.   MENTAL STATUS EXAM:  Alert, cooperative female with good eye contact.  Somewhat disheveled.  She was fidgety but cooperative.  Speech was clear  with even pace and tone.  Mood was anxious, fidgety and restless.  Thought  processes were coherent.  Positive suicidal ideation.  No homicidal  thoughts.  No auditory or visual hallucinations.  No delusions.  Cognition  was well-preserved.   ADMISSION DIAGNOSES:   AXIS I:  1.  Mood disorder not otherwise specified.  2.  Polysubstance abuse.   AXIS II:  No diagnosis.   AXIS III:  1.  Left below-the-knee amputation.  2.  Insulin-dependent diabetes mellitus.   AXIS IV:  Moderate.   AXIS V:  Global Assessment of Functioning upon admission 35; highest Global  Assessment of Functioning in the last year 60.   HOSPITAL COURSE:  She was admitted and she was started in individual and  group psychotherapy.  She was  detoxified with Librium.  She was placed back  on Depakote ER 500 mg in the morning and at night, Seroquel 100 mg at  bedtime, Prolixin 5 mg at night, Symmetrel 100 mg twice a day, metformin 100  mg twice a day, trazodone 100 mg at bedtime.  She was given some Bentyl and  Phenergan due to GI symptomatology.  Prolixin was increased to 7.5 mg at  night and 2.5 mg in the morning.  She endorsed that she wanted to go to a  residential treatment center.  She relapsed and has not been able to make  it.  Endorsed she has gotten more depressed, unable to sleep, unable to  function, a sense of hopelessness and helplessness.  Felt she could not make  it.  Felt suicidal.  There was some psychomotor retardation.  She was not as  spontaneous but quite overwhelmed with the way she was feeling.  Some  passive suicidal ideation.  Committed to going to a long-term facility.  By  the 25th, she was evidencing some active withdrawal,  GI cramping.  We helped  symptomatically.  She continued to evidence stomach cramping and nausea,  somewhat anxious, concerned about the physical symptoms but she continued to  improve in the next two days.  By the 28th, she was more engaged, less  reclusive, feeling better, physically-wise.  Sleep was better.  On May 29th,  she was in full contact with reality.  There were no suicidal ideation, no  homicidal ideation, no hallucinations, no delusions.  Working on abstinence.  Wanted to pursue medication further.  Fully detoxed.  She was discharged to  [facility] long-term treatment program.   DISCHARGE DIAGNOSES:   AXIS I:  1.  Polysubstance dependence.  2.  Mood disorder not otherwise specified.   AXIS II:  No diagnosis.   AXIS III:  1.  Left below-the-knee amputation.  2.  Insulin-dependent diabetes mellitus.   AXIS IV:  Moderate.   AXIS V:  Global Assessment of Functioning upon discharge 55.   DISCHARGE MEDICATIONS:  1.  Depakote ER 500 mg in the morning and 500 mg at night.  2.  Seroquel 100 mg at bedtime.  3.  Symmetrel 100 mg twice a day.  4.  Glucophage 500 mg in the morning and at 5 p.m.  5.  Trazodone 100 mg at night.  6.  Prolixin 5 mg, 2 at night.   FOLLOW UP:  To be going to [facility] in [place].  Follow up at the local mental health center.       IL/MEDQ  D:  07/21/2004  T:  07/22/2004  Job:  [id number]

## 2010-06-13 NOTE — Discharge Summary (Signed)
[name], WUEBKER NO.:  [id number]   MEDICAL RECORD NO.:  [id number]          PATIENT TYPE:  IPS   LOCATION:  0402                          FACILITY:  [facility]   PHYSICIAN:  Jasmine Pang, M.D. DATE OF BIRTH:  17-Sep-1965   DATE OF ADMISSION:  10/28/2005  DATE OF DISCHARGE:  11/01/2005                                 DISCHARGE SUMMARY   PATIENT IDENTIFICATION:  A 45 year old African American female who was  admitted on an involuntary basis on October 28, 2005.   HISTORY OF PRESENT ILLNESS:  The patient has a history of being brought in  on involuntary papers.  The petition states that she has been depressed and  wanting to step in front of a car.  She wrote a suicide note on her sheet to  her boyfriend.  She had relapsed on alcohol and cocaine.  She reports  compliance with her medications.  The patient was here in August 2007,  possibly the fourth admission.  Follow-up visit is at [facility].   MEDICATIONS:  1. Seroquel 200 mg two times daily.  2. Prolixin 5 times daily.  3. Lexapro 20 mg daily.  4. Risperdal 3 mg three times daily.  5. Cogentin 1 mg p.o. b.i.d.   PHYSICAL EXAMINATION:  The patient was an overweight female with her left  arm amputated.  She was in no acute physical distress.   ADMISSION LABORATORIES:  A CBC was unremarkable.  Glucose was 120.  Sodium  133.  Acetaminophen level less than 10.  Salicylate less than 4.  UDS  positive for cocaine.  Alcohol was 65.   HOSPITAL COURSE:  Upon admission, the patient was started on Ambien 10 mg  p.o. nightly p.r.n. insomnia and Seroquel 50 mg p.o. b.i.d. and  50 mg  nightly.  Geodon 10 mg IM x1 to be used only in emergencies was ordered.  This was not found to be necessary.  On October 29, 2005, the patient was  moved to the 400 hall for decreased stimulation.  The patient was also  started on Librium 25 mg q.4h. p.r.n. withdrawal.  On October 29, 2005, due  to nausea, the patient was started  on Phenergan 25 mg p.o. q.6h. p.r.n.  nausea.  On October 29, 2005, the patient was started on Seroquel 50 mg p.o.  q.6h. p.r.n. agitation.  On October 29, 2005, the patient was given the flu  vaccine.  The patient tolerated her medications well with no significant  side effects.   The patient done initially was very disruptive on the unit.  She was not  able to be programmed with the rest of the 300 and 500 hall patients who  were afraid of her.  She was transferred over to the intensive care hall for  better monitoring and decreased stimulation.  On October 30, 2005, the  patient was hostile and demanding on the unit.  She was not attending  groups.  She was expressing very entitled behavior.  She had stopped her  medications.  She was tearful.  She said I don't know  why I keep messing  up.  Her mother will not let her come back home until she is treated for  alcohol and crack dependence.  She states she feels nauseated when she eats,  but is on Phenergan which helps.  On October 31, 2005, the patient had been  irritable and demanding.  She was unable to have a roommate due to her  threatening stance towards them.  She was lying in bed with no eye contact.  The patient slept well.  Appetite was good.  She continued Seroquel,  Prolixin and Ambien.   On November 01, 2005, the patient's mental status had improved markedly.  She  was requesting discharge.  She had decided to go live in the shelter.  Her  mental status had improved.  She had good eye contact.  She was friendlier  and more cooperative.  She was verbal.  Psychomotor activity was within  normal limits.  Mood was less irritable.  Affect was wide range.  There was  no suicidal or homicidal ideation.  No self injurious behavior.  No auditory  or visual hallucinations.  No paranoia or delusions.  The thoughts were  logical and goal-directed.  Thought content no predominant theme.  Cognitive  was grossly within normal limits.    DISCHARGE DIAGNOSES:  AXIS I.  1. Bipolar disorder, mixed episode.  2. Polysubstance dependence.  AXIS II.  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  AXIS III.  Left forearm amputation diabetes.  AXIS IV.  Moderate (medical problems, housing problems).  AXIS V.  Global Assessment of Functioning upon discharge was 50.  Global  Assessment of Functioning upon admission was 35.  Global Assessment of  Functioning highest past year was 65.   DISCHARGE/PLAN:  There were no specific activity level or dietary  restrictions.  The patient was to follow up with her primary care Ellysa Parrack  in [place], [place], for diabetes management and immunizations.   DISCHARGE MEDICATIONS:  1. Seroquel 50 mg b.i.d. and h.s.  2. Prolixin 5 mg at h.s.  3. Ambien 10 mg at h.s.  4. Benztropine 1 mg b.i.d. p.r.n. muscle stiffness.   POST HOSPITAL CARE PLANS:  Case manager will call the patient with her  appointment since this was Sunday.  The case manager will call the patient  on Monday November 02, 2005.      Jasmine Pang, M.D.  Electronically Signed     BHS/MEDQ  D:  11/02/2005  T:  11/02/2005  Job:  [id number]

## 2010-06-13 NOTE — H&P (Signed)
[name], [name]                 ACCOUNT NO.:  [id number]   MEDICAL RECORD NO.:  [id number]          PATIENT TYPE:  IPS   LOCATION:  0401                          FACILITY:  [facility]   PHYSICIAN:  Syed T. Arfeen, M.D.   DATE OF BIRTH:  09/23/1965   DATE OF ADMISSION:  03/02/2004  DATE OF DISCHARGE:  03/14/2004                         PSYCHIATRIC ADMISSION ASSESSMENT   IDENTIFYING INFORMATION:  This is a 45 year old single African-American  female involuntarily committed on March 03, 2003.   HISTORY OF PRESENT ILLNESS:  The patient presents with a history of a  suicide attempt.  The patient states that on the weekend prior to this  admission she was cutting her right wrist with a razor.  She was feeling  very depressed.  She was having relationship issues with her boyfriend.  The  patient has been drinking daily, up to 3-4 pints, reporting her drinking has  been escalating.  Her last drink was on March 02, 2004.  The patient is a  transfer from [facility].  The patient's boyfriend was admitted as  well and is the reason for the patient requiring a different hospital.  Chart indicates that boyfriend was having some homicidal thoughts towards  her which, again, necessitated transfer to another facility.  The patient  has been using cocaine.  She is experiencing positive auditory  hallucinations, command-type, to hurt herself.  The patient does promise  safety on the unit.   PAST PSYCHIATRIC HISTORY:  First admission to [facility].  Was  at [facility] about two months ago for cutting.  She sees [facility] Harrison Mons [facility]  She has a history of overdosing in December  of 2005 on Seroquel.  She has no history of being detoxed before.   SOCIAL HISTORY:  This is a 45 year old single African-American female with  no children.  She lives with her mother.  She is on disability for  psychiatric illness.  No legal problems.   FAMILY HISTORY:  None.   ALCOHOL/DRUG HISTORY:  The patient smokes a pack a day.  She has been  drinking brandy, drinking up to 3-4 pints daily.  Denies any a.m. drinking.  Denies any blackouts or seizure activity.  Denies any drug use, although she  did elude to cocaine use earlier.   PRIMARY CARE PHYSICIAN:  [facility].   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   MEDICATIONS:  Has been on trazodone, Vistaril, Risperdal 1 mg twice a day  and Depakote ER 1000 mg at bedtime.  She reports compliance with her  medications.   ALLERGIES:  None.   PHYSICAL EXAMINATION:  The patient was assessed at [facility].  This is a somewhat unkempt female in no acute distress and obvious  amputation to the left arm.  Her vital signs are temperature 97.3, heart  rate 98, respirations 20, blood pressure 97/65.  She is 5 feet 4 inches  tall.   LABORATORY DATA:  Alcohol level is  104.  RDW 15.8.  Her electrolytes are  within normal limits.  Urine drug screen is positive for cocaine.  Valproic  acid level was less than 10.  Hemoglobin 11.3, hematocrit 34, glucose 150.  Urinalysis was negative.  TSH is 2.377.   MENTAL STATUS EXAM:  She is an alert, cooperative female, unkempt with fair  eye contact.  Her speech is very concrete, normal tone.  Mood is depressed.  The patient is constricted.  Thought processes with patient endorsing  positive auditory hallucinations, command-type.  Does not appear to be  actively responding.  Cognitively, the patient appears to have some  cognitive limitations.  Her judgment and insight are poor.  Poor historian.   DIAGNOSES:   AXIS I:  1.  Schizoaffective disorder.  2.  Alcohol and cocaine abuse.   AXIS II:  Deferred.   AXIS III:  Left below-the-elbow amputation.   AXIS IV:  Problems with primary support group, other psychosocial problems  related to chronic mental illness,  substance abuse, medical problems.   AXIS V:  Current 25; estimated this past year 60.   PLAN:  Involuntary commitment for psychotic symptoms, substance abuse, self-  inflicted injury.  Contract for safety.  Placed on 400 Hall for close  monitoring.  Will resume her antipsychotic.  Have Depakote available for  mood stabilization.  We will work on relapse prevention.  The patient to be  placed on the Librium protocol.  Will contact mother for background  information and to clarify living arrangements.  The patient is to follow  with [facility] to be medication-compliant.  To remain alcohol  and drug-free.   TENTATIVE LENGTH OF STAY:  Four to five days.      JO/MEDQ  D:  03/31/2004  T:  04/01/2004  Job:  [id number]

## 2010-06-13 NOTE — H&P (Signed)
Stacy, [name] NO.:  [id number]   MEDICAL RECORD NO.:  [id number]          PATIENT TYPE:  IPS   LOCATION:  0507                          FACILITY:  [facility]   PHYSICIAN:  Jeanice Lim, M.D. DATE OF BIRTH:  10-Jul-1965   DATE OF ADMISSION:  06/16/2004  DATE OF DISCHARGE:                         PSYCHIATRIC ADMISSION ASSESSMENT   IDENTIFYING INFORMATION:  The patient is a 45 year old single African-  American female who was involuntarily admitted to the [facility] on Jun 16, 2004.   HISTORY OF PRESENT ILLNESS:  The patient states that she has felt depressed  and like killing herself for a week.  She complains of decreased sleep for a  week.  She has suicidal ideation to jump into traffic.  She says that she  has been sad and crying daily.  She has positive auditory hallucinations,  voices that tell her to hurt herself.  She denies any visual hallucinations  or homicidal ideation or any mania.  She also describes some anxiety.  She  reports that she called her mother with these complaints and her mother  brought her to the [facility].  She desires treatment for  alcohol and drug problems at [facility] in [place], [place].   PAST PSYCHIATRIC HISTORY:  She was inpatient here at the [facility] in February of 2006.  She is followed by a Dr. Vedia Coffer at [facility] in [place].  She was previously at [facility] in  [place] but only for detoxification and then she went to [facility] in [facility] for 21 days.  She reports that she does not want to return to [facility].  She prefers inpatient treatment at [facility].   SOCIAL HISTORY:  The patient is homeless at this time.  She is unemployed.  She has a ninth grade education.  She previously worked as a Advertising copywriter at  [employer].  She says that she has no social support other than her  mother.  She has a 1 year old brother and she is on probation for assault.  She denies any family history of mental illness or substance abuse.  She  began drinking alcohol at age 23.  She drinks it daily now, approximately a  case of beer per day.  She smokes crack cocaine daily, approximately $400  worth per day and she smokes marijuana, she says, about every other day.   PRIMARY CARE PHYSICIAN:  She reports it is Dr. Gerre Pebbles at [facility], although attempts to reach this physician were unsuccessful as  the only Dr. Gerre Pebbles listed in [facility] is a dentist.   MEDICAL PROBLEMS:  Her medical include insulin-dependent diabetes mellitus  and she has a left below-the-elbow amputation.   MEDICATIONS:  Per the [employer] [facility] in [facility].  Most recently, she was on  Depakote ER 500 mg twice daily, Lantus insulin (unknown dosage), Seroquel  200 mg at bedtime, Imitrex q.24h., generic Pepcid (unknown dosage),  Wellbutrin XL 300 mg daily, amantadine 100 mg b.i.d., metformin 500 mg each  morning and each evening at  5 p.m. and trazodone 100 mg at bedtime.   ALLERGIES:  She has no known drug allergies.   PHYSICAL EXAMINATION:  Performed at [facility].  Here  at the [facility], vital signs were temperature 96, pulse 91,  respirations 20, blood pressure 140/85.  Her CBG was 105.  In general, she  is 5 feet 3-1/2 inch tall, 174 pound African-American female.   LABORATORY DATA:  Her CBC with low hemoglobin of 11.1 and hematocrit of  33.4.  Her potassium was low at 3.2.  Her liver function tests were normal  except for a low protein of 5.9 and albumin of 3.0.  Her urine pregnancy  test was negative.  Her urine drug screen was positive for cocaine and  marijuana.  She had an alcohol level of less than 5.  Her urinalysis was  negative for infection, although it was amber colored, cloudy and had a high  specific gravity of 1.037.  She had many bilirubin, trace ketone, large  blood, protein reading of 30 and trace leukocytes and many  squamous  epithelial cells and a few bacteria.  Her TSH was pending at the time of  this exam and there was an error in reading her Depakote level and that will  be rechecked.   MENTAL STATUS EXAM:  She was alert and oriented x 4.  She had good eye  contact.  Her appearance was disheveled.  Her behavior was fidgety but she  was cooperative.  Her speech was clear with even pace and tone.  Her mood  was anxious.  She was fidgety and restless.  Her thought process was  coherent with positive suicidal ideation.  No homicidal ideation.  No  auditory or visual hallucinations.  No mania.  Her cognitive function with  concentration normal.  Her memory was intact.  Her insight is poor and her  impulse control is fair.   DIAGNOSES:   AXIS I:  1.  Schizoaffective disorder.  2.  Polysubstance abuse.   AXIS II:  Deferred.   AXIS III:  1.  Left below-the-elbow amputation.  2.  Insulin-dependent diabetes mellitus.   AXIS IV:  Severe (problems with her primary support group, problems with  education and occupation, housing problems and economic problems, problems  related to the legal system and medical problems).   AXIS V:   PLAN:  The initial plan is to admit the patient voluntarily.  Stabilize to  mood and thought.  Will start her on Librium alcohol detox protocol.  Will  give her Symmetrel for cocaine detox.  Will work to increase her coping  skills and decrease her stress.  She will need a referral for residential  drug and alcohol treatment program.  She can follow up with Dr. Vedia Coffer at  [facility] in [facility].   TENTATIVE LENGTH OF STAY:  Five to seven days.      AHW/MEDQ  D:  06/17/2004  T:  06/17/2004  Job:  [id number]

## 2010-06-13 NOTE — Discharge Summary (Signed)
[name], [name] NO.:  [id number]   MEDICAL RECORD NO.:  [id number]          PATIENT TYPE:  IPS   LOCATION:  0407                          FACILITY:  [facility]   PHYSICIAN:  Anselm Jungling, MD  DATE OF BIRTH:  1965/11/15   DATE OF ADMISSION:  12/08/2006  DATE OF DISCHARGE:  12/14/2006                               DISCHARGE SUMMARY   IDENTIFYING DATA AND REASON FOR ADMISSION:  This was an inpatient  psychiatric admission for [name], a 45 year old, African-American female  with a history of psychosis, mood disorder, and substance abuse.  She  presented this time with depression, suicidal ideation, and reports of  auditory hallucinations.  She admitted to having gone off her  psychotropic medication, as well as abusing cocaine and alcohol heavily.  Please refer to the admission note for further details pertaining to the  symptoms, circumstances, and history that led to her hospitalization.  She was given an initial axis I diagnosis of schizoaffective disorder,  NOS, and cocaine and alcohol dependencies.   MEDICAL AND LABORATORY:  The patient was medically and physically  assessed by the psychiatric nurse practitioner.  She is HIV positive.  She was not evidencing any acute or active medical problems.  There were  no significant medical issues during this hospital stay.   HOSPITAL COURSE:  The patient was admitted to the Adult Inpatient  Psychiatric Service.  She presented as a moderately obese, African-  American female who was post above the elbow amputee on the left.  She  was awake, alert, fully oriented, with depressed mood and sad affect.  There was no overt sign or symptom of thought disorder.  She did admit  to auditory hallucinations but did not appear to be responding to  internal stimuli.  She made no delusional statements.  She verbalized a  strong desire for help and denied any active suicidal ideation.   The patient was placed on detoxification  protocols for alcohol, based  upon a Librium taper.  She was given Symmetrel 100 mg b.i.d. to address  cocaine craving.  She was restarted on Risperdal 2 mg b.i.d.  Seroquel  100 mg was later added to her regimen to assist with insomnia.  She was  a reasonably good participant in the treatment program.  She worked  closely with the case manager towards an aftercare plan that involved  going to a residential rehab center.  She was appropriate for discharge  on the seventh hospital day.   AFTERCARE:  The patient was to followup with the [facility] in [facility], with an appointment on December 16, 2006.  The patient had been  anticipating a possible entry into [facility].  In fact, she had  an intake appointment for the [facility], but without the knowledge  of the case manager, went ahead and cancelled that on her own, her  explanation being that she was convinced that they would not accept her  anyway.  It did not appear feasible for the patient to have a  rescheduled appointment with [facility] within a timeframe  in which we would  be working with her.  The patient was discharged at her request.   DISCHARGE MEDICATIONS:  1. Symmetrel 100 mg b.i.d.  2. Folic acid 1 mg daily.  3. Thiamine 100 mg daily.  4. Cogentin 1 mg b.i.d.  5. Colace 100 mg b.i.d.  6. Risperdal 2 mg b.i.d.  7. Seroquel 100 mg q.h.s.   DISCHARGE DIAGNOSES:  AXIS I:  Schizoaffective disorder, not otherwise  specified, and polysubstance dependence, early remission.  AXIS II:  Deferred.  AXIS III:  No acute or chronic illnesses.  AXIS IV:  Stressors severe.  AXIS V:  GAF on discharge 50.      Anselm Jungling, MD  Electronically Signed     SPB/MEDQ  D:  12/15/2006  T:  12/16/2006  Job:  [id number]

## 2010-06-13 NOTE — Discharge Summary (Signed)
[name], CARBY                 ACCOUNT NO.:  [id number]   MEDICAL RECORD NO.:  [id number]          PATIENT TYPE:  IPS   LOCATION:  0402                          FACILITY:  [facility]   PHYSICIAN:  Anselm Jungling, MD  DATE OF BIRTH:  1965-03-25   DATE OF ADMISSION:  10/28/2005  DATE OF DISCHARGE:  11/01/2005                                 DISCHARGE SUMMARY   IDENTIFYING DATA/REASON FOR ADMISSION:  The patient is a 45 year old African  American female with a history of substance abuse and bipolar disorder.  She  was admitted in crisis, with suicidal ideation.  This was her second  [facility] admission.  She had been abusing both cocaine and  alcohol.  She came to [place] on a regimen of Seroquel, Prolixin, Lexapro,  Risperdal, and Cogentin.  Please refer to the admission note for further  details pertaining to the symptoms, circumstances and history that led to  her hospitalization.   INITIAL DIAGNOSTIC IMPRESSION:  She was given initial AXIS I diagnoses of  polysubstance abuse/dependence, and history of bipolar disorder.   MEDICAL/LABORATORY:  The patient was medically and physically assessed by  the psychiatric nurse practitioner.  She had some history of glucose  intolerance.  There were no acute medical issues during her inpatient stay.   HOSPITAL COURSE:  The patient was admitted to the adult inpatient  psychiatric service.  She presented as a well-nourished, well-developed  female, who had an amputated left arm.  She was depressed, dysphoric, but  interacted fairly pleasantly initially.  She denied suicidal ideation, plan  or intent.  There were no signs or symptoms of psychosis or thought  disorder.  The patient, after initially being fairly pleasant and  cooperative, became more difficult, irritable, hostile and demanding.  She  was placed on a regimen of Seroquel, and placed on a Librium withdrawal  protocol to address alcohol cessation symptoms.  She was a poor  participant  in the treatment program, refusing to attend therapeutic groups and  activities for the most part.   She had been somewhat threatening towards roommate and, because of this, we  determined that she could not have a roommate.  She appeared to tolerate  medications, and Prolixin 5 mg at bedtime was added to her regimen, which  was well-tolerated as well.  Cogentin 1 mg twice daily was added to her  regimen to address extrapyramidal symptoms on an as-needed basis.   On the fifth hospital day, the patient indicated that she wanted discharge.  She was open to going to a shelter to live.  She indicated that she would  continue to take psychotropic medications as we were recommending.  She was  absent suicidal ideation.   AFTERCARE:  The patient was discharged on the weekend and, as such, the plan  was for the casemanager to contact the patient immediately following the  weekend with specific dates and times for follow-up.  She was instructed to  follow up with her primary care doctor in [facility] for diabetic  management, as well as immunizations.   DISCHARGE  MEDICATIONS:  1. Seroquel 50 mg t.i.d.  2. Prolixin 5 mg q.h.s.  3. Ambien 10 mg q.h.s.  4. Benztropine 1 mg twice daily p.r.n. extrapyramidal symptoms/muscle      stiffness.   DISCHARGE DIAGNOSES:  AXIS I:  Schizoaffective disorder, currently mixed,  resolving, without psychotic features.  History of polysubstance  abuse/dependence.  AXIS II:  AXIS III:  Glucose intolerance.  AXIS IV:  Stressors:  Severe.  AXIS V:  GAF on discharge 60.      Anselm Jungling, MD  Electronically Signed     SPB/MEDQ  D:  11/02/2005  T:  11/02/2005  Job:  [id number]

## 2010-06-13 NOTE — Discharge Summary (Signed)
[name], [name]                 ACCOUNT NO.:  [id number]   MEDICAL RECORD NO.:  [id number]          PATIENT TYPE:  IPS   LOCATION:  0401                          FACILITY:  [facility]   PHYSICIAN:  [name], M.D.   DATE OF BIRTH:  May 22, 1965   DATE OF ADMISSION:  03/02/2004  DATE OF DISCHARGE:  03/14/2004                                 DISCHARGE SUMMARY   IDENTIFYING INFORMATION:  This is a 45 year old African American female, who  was involuntarily admitted on February 5.  The patient presented with a  history of a suicidal attempt.  She stated that on the weekend prior to this  admission she was cutting her right wrist with a razor.  She was feeling  really depressed, anxious secondary to the relationship issue with her  boyfriend.  The patient has been drinking daily up to 3-4 pints and endorsed  that drinking has been escalated.  Her last drink was on March 02, 2004.  Patient is a transfer from [facility].  The patient's  boyfriend was admitted as well as he was the reason for the patient  requesting a different hospital.  As per chart, boyfriend was having some  homicidal thoughts towards her, which again required transfer to another  facility.  The patient also endorsed that she has been using cocaine and  experiencing positive auditory hallucinations to hurt herself.   PAST PSYCHIATRIC HISTORY:  This is patient's first admission to [facility], however, has been in [facility] two months ago for a suicidal  attempt.  Dr. Harrison Mons at [facility] is her outpatient  psychiatrist.  She had a history of overdose in December 2005 on Seroquel.   ALCOHOL AND DRUG HISTORY:  Patient smokes a pack a day.  Has been drinking  brandy, drinking up to 3-4 pints daily.  Denies any blackout of seizure  activity.  She denied any drug abuse, but she did admit to using cocaine  earlier.   MEDICAL PROBLEMS:  The patient has a left below-elbow  amputation, which  happened in 2005, which she reports from a nerve injury.   MEDICATIONS:  The patient has been on trazodone, Restoril, Risperdal,  Depakote at bedtime.  She reported that she has been complying with her  medication.   ALLERGIES:  No known drug allergies.   PHYSICAL EXAMINATION:  The patient was assessed at [facility].  Her vitals are essentially within normal limits.   LABORATORY DATA:  Alcohol level was 104, RDW 15.8.  Electrolytes are within  normal limits.  Urine drug screen is positive for cocaine.  Valproic acid  was lower than 10.   MENTAL STATUS EXAM:  The patient is alert, cooperative, but unkempt with  fair eye contact.  Speech is normal tone.  Mood is depressed.  Affect is  constricted.  Thought process is contained, positive auditory  hallucinations, command type to hurt herself.  Cognition fairly and grossly  normal.  Judgment and insight limited.  Patient is a poor historian.   DIAGNOSIS:  AXIS I:  Schizoaffective disorder.  Alcohol and cocaine abuse.   AXIS II:  Deferred.   AXIS III:  Left below-elbow amputation.   AXIS IV:  Problem with her primary support group.  Chronic mental illness.  Substance abuse.   AXIS V:  20.   HOSPITAL COURSE:  The patient was admitted and moved to 400 hall for close  monitoring.  She was placed on a safety check and she was started on p.r.n.  standard medication.  She was restarted on Depakote and Risperdal.  Dose was  adjusted according to her response and it was gradually increased.  She was  also started on Wellbutrin and Symmetrel.  Besides both of these  medications, she was also started on Lodine, low dose of Librium for alcohol  detox.  She was closely monitored for any substance abuse and drug  dependence.  She was encouraged to participate in substance abuse groups and  educated about the problems related to these illegal drugs.  She was also  started on Seroquel as she continued to  complain about insomnia.  Medical  consult was called for acute onset of diabetes and patient was started on  Glucophage and Lantus insulin as per medical consult.  Patient was also  started on Pepcid for GI distress.  Depakote doses were continued to be  optimized with Seroquel as patient continued to endorse racing thoughts,  insomnia, and mood lability.  She was encouraged to participate in group  sessions, which she started to attend.  She continues to have mood lability  and a few times she was given Seroquel as a p.r.n. for her agitation and  mood lability.  Patient started to show some improvement and reported that  her voices have been less intense and she was seen less irritable and less  angry.  Her mother was involved in her discharge planning, who initially  stated that patient is not ready, but over the course of the hospitalization  mother also acknowledged that patient has been doing better, though she was  still concerned about the medication compliance.  Medication was continued  to be optimized and patient agreed to take Risperdal Consta injection, which  was given on February 7.  She reported no side effects with the medication  and feels upbeat with the current regimen.  Discharge planning discussed at  length and patient feels that she is ready to be discharged with a followup  to Lin Landsman on Tuesday, February 25 at 10:00 a.m.  Patient was also  given appointment to biotech on Friday, February 24 at 11:00 a.m.  Phone  number [phone].  Address is [address], [place], [place].   CONDITION ON DISCHARGE:  Remarkably improved.  Mood euthymic.  Affect  bright.  Thought processes logical, goal-directed.  She denies any auditory  hallucination, suicidal ideation, or homicidal ideation.  She was seen very  verbal, socially interactive in groups, and reported a positive and better response with medications.  She feels up with increased social and  coping  skills.  She agrees with the current regimen and feels much better and would  like to continue to follow up.   DISCHARGE MEDICATION:  1.  Symmetrel 100 mg b.i.d. for one more day.  2.  Wellbutrin 300 mg daily.  3.  Risperdal Consta due again on February 21.  4.  Seroquel 200 mg h.s.  5.  Risperdal 1 mg and she was advised to take only one more day.  6.  Depakote 1,500 mg at bedtime.  The patient's Depakote was 71.5 on      February 13.  7.  Pepcid 20 mg two times a day.  8.  Glucophage 500 mg 7:00 and 5:00 p.m.  9.  Lantus insulin 10 units subcu at bedtime.   DISPOSITION:  Patient was discharged to see Lin Landsman on Tuesday,  February 21 at 10 a.m. and biotech Friday, February 24 at 11a.m.  She was  also told that at [facility], Manuella Ghazi will contact you for  diabetic management and teaching of checking the sugar.  Patient was also  given a followup with [facility], phone number [phone], on Monday,  February 20 at 10:30 at [address] [facility].      STA/MEDQ  D:  04/09/2004  T:  04/09/2004  Job:  [id number]

## 2010-06-28 ENCOUNTER — Emergency Department (HOSPITAL_COMMUNITY)
Admission: EM | Admit: 2010-06-28 | Discharge: 2010-06-28 | Disposition: A | Discharge status: Home / Self Care | Payer: Medicaid Other | Attending: Emergency Medicine | Admitting: Emergency Medicine

## 2010-06-28 DIAGNOSIS — F411 Generalized anxiety disorder: Secondary | ICD-10-CM | POA: Insufficient documentation

## 2010-06-28 DIAGNOSIS — I10 Essential (primary) hypertension: Secondary | ICD-10-CM | POA: Insufficient documentation

## 2010-06-28 DIAGNOSIS — G2402 Drug induced acute dystonia: Secondary | ICD-10-CM | POA: Insufficient documentation

## 2010-06-28 DIAGNOSIS — Z79899 Other long term (current) drug therapy: Secondary | ICD-10-CM | POA: Insufficient documentation

## 2010-06-28 DIAGNOSIS — F209 Schizophrenia, unspecified: Secondary | ICD-10-CM | POA: Insufficient documentation

## 2010-06-28 DIAGNOSIS — R000 Tachycardia, unspecified: Secondary | ICD-10-CM | POA: Insufficient documentation

## 2010-06-28 DIAGNOSIS — F319 Bipolar disorder, unspecified: Secondary | ICD-10-CM | POA: Insufficient documentation

## 2010-07-01 ENCOUNTER — Emergency Department (HOSPITAL_COMMUNITY)
Admission: EM | Admit: 2010-07-01 | Discharge: 2010-07-02 | Disposition: A | Discharge status: Other Acute Inpatient Hospital | Payer: Medicaid Other | Attending: Emergency Medicine | Admitting: Emergency Medicine

## 2010-07-01 DIAGNOSIS — R45851 Suicidal ideations: Secondary | ICD-10-CM | POA: Insufficient documentation

## 2010-07-01 DIAGNOSIS — F329 Major depressive disorder, single episode, unspecified: Secondary | ICD-10-CM | POA: Insufficient documentation

## 2010-07-01 DIAGNOSIS — Z21 Asymptomatic human immunodeficiency virus [HIV] infection status: Secondary | ICD-10-CM | POA: Insufficient documentation

## 2010-07-01 DIAGNOSIS — F3289 Other specified depressive episodes: Secondary | ICD-10-CM | POA: Insufficient documentation

## 2010-07-01 DIAGNOSIS — I10 Essential (primary) hypertension: Secondary | ICD-10-CM | POA: Insufficient documentation

## 2010-07-01 DIAGNOSIS — S0003XA Contusion of scalp, initial encounter: Secondary | ICD-10-CM | POA: Insufficient documentation

## 2010-07-01 DIAGNOSIS — R229 Localized swelling, mass and lump, unspecified: Secondary | ICD-10-CM | POA: Insufficient documentation

## 2010-07-01 DIAGNOSIS — S1093XA Contusion of unspecified part of neck, initial encounter: Secondary | ICD-10-CM | POA: Insufficient documentation

## 2010-07-02 ENCOUNTER — Emergency Department (HOSPITAL_COMMUNITY): Payer: Medicaid Other

## 2010-07-02 ENCOUNTER — Inpatient Hospital Stay (HOSPITAL_COMMUNITY)
Admission: AD | Admit: 2010-07-02 | Discharge: 2010-07-09 | DRG: 885 | Disposition: A | Discharge status: Home / Self Care | Payer: Medicaid Other | Source: Ambulatory Visit | Attending: Psychiatry | Admitting: Psychiatry

## 2010-07-02 DIAGNOSIS — F1020 Alcohol dependence, uncomplicated: Secondary | ICD-10-CM

## 2010-07-02 DIAGNOSIS — F259 Schizoaffective disorder, unspecified: Secondary | ICD-10-CM

## 2010-07-02 DIAGNOSIS — S0003XA Contusion of scalp, initial encounter: Secondary | ICD-10-CM

## 2010-07-02 DIAGNOSIS — R45851 Suicidal ideations: Secondary | ICD-10-CM

## 2010-07-02 DIAGNOSIS — Z91199 Patient's noncompliance with other medical treatment and regimen due to unspecified reason: Secondary | ICD-10-CM

## 2010-07-02 DIAGNOSIS — F29 Unspecified psychosis not due to a substance or known physiological condition: Principal | ICD-10-CM

## 2010-07-02 DIAGNOSIS — I10 Essential (primary) hypertension: Secondary | ICD-10-CM

## 2010-07-02 DIAGNOSIS — Z9119 Patient's noncompliance with other medical treatment and regimen: Secondary | ICD-10-CM

## 2010-07-02 DIAGNOSIS — F1420 Cocaine dependence, uncomplicated: Secondary | ICD-10-CM

## 2010-07-02 DIAGNOSIS — Z21 Asymptomatic human immunodeficiency virus [HIV] infection status: Secondary | ICD-10-CM

## 2010-07-02 DIAGNOSIS — S48119A Complete traumatic amputation at level between unspecified shoulder and elbow, initial encounter: Secondary | ICD-10-CM

## 2010-07-02 LAB — BASIC METABOLIC PANEL
BUN: 6 mg/dL (ref 6–23)
CO2: 27 mEq/L (ref 19–32)
Calcium: 8.5 mg/dL (ref 8.4–10.5)
Creatinine, Ser: 0.75 mg/dL (ref 0.4–1.2)
GFR calc Af Amer: >60 mL/min (ref >60)

## 2010-07-02 LAB — CBC
HCT: 34.1 % — ABNORMAL LOW (ref 36.0–46.0)
MCHC: 34 g/dL (ref 30.0–36.0)
RDW: 16.6 % — ABNORMAL HIGH (ref 11.5–15.5)

## 2010-07-02 LAB — RAPID URINE DRUG SCREEN, HOSP PERFORMED
Opiates: NOT DETECTED
Tetrahydrocannabinol: NOT DETECTED

## 2010-07-02 LAB — DIFFERENTIAL
Basophils Absolute: 0 10*3/uL (ref 0.0–0.1)
Basophils Relative: 0 % (ref 0–1)
Eosinophils Relative: 5 % (ref 0–5)
Lymphocytes Relative: 50 % — ABNORMAL HIGH (ref 12–46)
Monocytes Absolute: 0.8 10*3/uL (ref 0.1–1.0)

## 2010-07-03 DIAGNOSIS — F1420 Cocaine dependence, uncomplicated: Secondary | ICD-10-CM

## 2010-07-03 DIAGNOSIS — F259 Schizoaffective disorder, unspecified: Secondary | ICD-10-CM
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
## 2010-07-24 ENCOUNTER — Emergency Department (HOSPITAL_COMMUNITY): Payer: Medicaid Other

## 2010-07-24 ENCOUNTER — Inpatient Hospital Stay (HOSPITAL_COMMUNITY)
Admission: EM | Admit: 2010-07-24 | Discharge: 2010-07-27 | DRG: 917 | Disposition: A | Discharge status: Home / Self Care | Payer: Medicaid Other | Attending: Internal Medicine | Admitting: Internal Medicine

## 2010-07-24 DIAGNOSIS — T43502A Poisoning by unspecified antipsychotics and neuroleptics, intentional self-harm, initial encounter: Secondary | ICD-10-CM

## 2010-07-24 DIAGNOSIS — T43501A Poisoning by unspecified antipsychotics and neuroleptics, accidental (unintentional), initial encounter: Principal | ICD-10-CM | POA: Diagnosis present

## 2010-07-24 DIAGNOSIS — Z21 Asymptomatic human immunodeficiency virus [HIV] infection status: Secondary | ICD-10-CM

## 2010-07-24 DIAGNOSIS — F1410 Cocaine abuse, uncomplicated: Secondary | ICD-10-CM | POA: Diagnosis present

## 2010-07-24 DIAGNOSIS — T438X2A Poisoning by other psychotropic drugs, intentional self-harm, initial encounter: Secondary | ICD-10-CM

## 2010-07-24 DIAGNOSIS — J9600 Acute respiratory failure, unspecified whether with hypoxia or hypercapnia: Secondary | ICD-10-CM

## 2010-07-24 DIAGNOSIS — F1010 Alcohol abuse, uncomplicated: Secondary | ICD-10-CM | POA: Diagnosis present

## 2010-07-24 DIAGNOSIS — S58119A Complete traumatic amputation at level between elbow and wrist, unspecified arm, initial encounter: Secondary | ICD-10-CM

## 2010-07-24 DIAGNOSIS — R4182 Altered mental status, unspecified: Secondary | ICD-10-CM | POA: Diagnosis present

## 2010-07-24 DIAGNOSIS — F319 Bipolar disorder, unspecified: Secondary | ICD-10-CM | POA: Diagnosis present

## 2010-07-24 DIAGNOSIS — F411 Generalized anxiety disorder: Secondary | ICD-10-CM | POA: Diagnosis present

## 2010-07-24 DIAGNOSIS — F259 Schizoaffective disorder, unspecified: Secondary | ICD-10-CM | POA: Diagnosis present

## 2010-07-24 LAB — COMPREHENSIVE METABOLIC PANEL
ALT: 37 U/L — ABNORMAL HIGH (ref 0–35)
AST: 34 U/L (ref 0–37)
Alkaline Phosphatase: 89 U/L (ref 39–117)
CO2: 23 mEq/L (ref 19–32)
Calcium: 7.7 mg/dL — ABNORMAL LOW (ref 8.4–10.5)
GFR calc Af Amer: >60 mL/min (ref >60)
Glucose, Bld: 170 mg/dL — ABNORMAL HIGH (ref 70–99)
Potassium: 3.5 mEq/L (ref 3.5–5.1)
Sodium: 135 mEq/L (ref 135–145)
Total Protein: 6.3 g/dL (ref 6.0–8.3)

## 2010-07-24 LAB — URINALYSIS, ROUTINE W REFLEX MICROSCOPIC
Bilirubin Urine: NEGATIVE
Hgb urine dipstick: NEGATIVE
Nitrite: NEGATIVE
Specific Gravity, Urine: 1.029 (ref 1.005–1.030)
pH: 5.5 (ref 5.0–8.0)

## 2010-07-24 LAB — DIFFERENTIAL
Eosinophils Relative: 3 % (ref 0–5)
Lymphocytes Relative: 44 % (ref 12–46)
Lymphs Abs: 4.2 10*3/uL — ABNORMAL HIGH (ref 0.7–4.0)
Neutro Abs: 4.3 10*3/uL (ref 1.7–7.7)
Neutrophils Relative %: 45 % (ref 43–77)

## 2010-07-24 LAB — CBC
HCT: 30.3 % — ABNORMAL LOW (ref 36.0–46.0)
Hemoglobin: 10.6 g/dL — ABNORMAL LOW (ref 12.0–15.0)
MCV: 77.7 fL — ABNORMAL LOW (ref 78.0–100.0)
RBC: 3.9 MIL/uL (ref 3.87–5.11)
WBC: 9.6 10*3/uL (ref 4.0–10.5)

## 2010-07-24 LAB — POCT I-STAT 3, ART BLOOD GAS (G3+)
Acid-base deficit: 3 mmol/L — ABNORMAL HIGH (ref 0.0–2.0)
Bicarbonate: 23 mEq/L (ref 20.0–24.0)
O2 Saturation: 95 %
pCO2 arterial: 42.3 mmHg (ref 35.0–45.0)
pO2, Arterial: 78 mmHg — ABNORMAL LOW (ref 80.0–100.0)

## 2010-07-24 LAB — RAPID URINE DRUG SCREEN, HOSP PERFORMED
Amphetamines: NOT DETECTED
Barbiturates: NOT DETECTED
Cocaine: NOT DETECTED
Opiates: NOT DETECTED
Tetrahydrocannabinol: NOT DETECTED

## 2010-07-24 LAB — ACETAMINOPHEN LEVEL: Acetaminophen (Tylenol), Serum: <15 ug/mL (ref 10–30)

## 2010-07-25 ENCOUNTER — Inpatient Hospital Stay (HOSPITAL_COMMUNITY): Payer: Medicaid Other

## 2010-07-25 LAB — BASIC METABOLIC PANEL
BUN: 12 mg/dL (ref 6–23)
CO2: 21 mEq/L (ref 19–32)
Calcium: 7.4 mg/dL — ABNORMAL LOW (ref 8.4–10.5)
GFR calc non Af Amer: >60 mL/min (ref >60)
Glucose, Bld: 100 mg/dL — ABNORMAL HIGH (ref 70–99)

## 2010-07-25 LAB — CBC
HCT: 28.8 % — ABNORMAL LOW (ref 36.0–46.0)
Hemoglobin: 9.7 g/dL — ABNORMAL LOW (ref 12.0–15.0)
MCH: 26.4 pg (ref 26.0–34.0)
MCHC: 33.7 g/dL (ref 30.0–36.0)
RBC: 3.67 MIL/uL — ABNORMAL LOW (ref 3.87–5.11)

## 2010-07-25 LAB — GLUCOSE, CAPILLARY: Glucose-Capillary: 98 mg/dL (ref 70–99)

## 2010-07-26 ENCOUNTER — Inpatient Hospital Stay (HOSPITAL_COMMUNITY): Payer: Medicaid Other

## 2010-07-26 DIAGNOSIS — F259 Schizoaffective disorder, unspecified: Secondary | ICD-10-CM

## 2010-07-27 NOTE — Consult Note (Signed)
[name], SHIFLETT NO.:  [id number]  MEDICAL RECORD NO.:  [id number]  LOCATION:  3017                         FACILITY:  [facility]  PHYSICIAN:  Franchot Gallo, MD     DATE OF BIRTH:  1965-05-12  DATE OF CONSULTATION: DATE OF DISCHARGE:                                CONSULTATION   CHIEF COMPLAINT:  "I overdosed on my Seroquel."  HISTORY OF PRESENT ILLNESS:  [name] is a 45 year old single black female who was admitted to [facility] after apparently over taking the medication Seroquel.  The patient states that she is unsure why she took medication, but was feeling "down" secondary to a recent miscarriage. She states however that since she has been at [facility], her depression has resolved and she feels ready for discharge.  The patient reports that she is sleeping well without difficulty and reports a good appetite.  She denies any current feelings of sadness, anhedonia, or depressed mood and states that her depression on a scale of 1-10 is 0.  The patient denies any current suicidal or homicidal ideation, but does have a history of at least 2 past suicide attempts with her last gestures by overdosing on medications.  The patient states that in the past when she is not on medications, she does experience auditory hallucinations but denies any current auditory or visual hallucinations or delusional thinking.  She also denies any current anxiety symptoms.  The patient does have a history of alcohol and illicit drug abuse.  She states that she does now occasionally use a "joint" but denies any recent use of cocaine or alcohol.  On admission, the patient's urine drug screen was positive for benzodiazepines only.  PAST PSYCHIATRIC HISTORY:  The patient has numerous past psychiatric hospitalizations to behavioral health with her last hospitalization occurring from July 02, 2010 to July 09, 2010.  The patient has also been seen at [facility] in [place],  [place], but is currently being followed at [facility], which is at phone number of [phone]. The patient has tried numerous psychiatric medications, but feels that she was most stable on the medications prescribed at her last discharge.  CURRENT MEDICATIONS: 1. Amantadine 100 mg p.o. b.i.d. 2. Prozac 20 mg p.o. q.a.m. 3. Seroquel 300 mg p.o. t.i.d. 4. Ambien 10 mg p.o. at bedtime - p.r.n. for sleep.  (These were     medications prescribed at the time of her last psychiatric     discharge).  ALLERGIES: 1. THORAZINE - results of muscle spasms. 2. LIBRIUM, reportedly results of muscle spasms. 3. The patient reports that ATIVAN also reports results of muscle     spasms.  MEDICAL ILLNESSES: 1. Left upper extremity amputation following reported suicide attempts    of walking in front of a motor vehicle. 2. The patient is HIV positive since May 2006. 3. Recent over use of Seroquel.  PAST OPERATIONS:  Status post laparotomy in 2000 for bowel obstruction.  FAMILY HISTORY:  The patient denies any family history, psychiatric or substance use related illnesses.  SOCIAL HISTORY:  The patient currently lives in [place] with her fiance.  The patient states that she is single  and has 3 children, a daughter of 7 years of age and 2 sons and 17 and 61 years of age who live with mother.  She states that she completed 10th grade of school and is currently receiving disability benefits.  She reports smoking 1 pack of cigarettes per day as well as occasional use of cannabis.  On review of the patient's records, it appears that she has also had a history of cocaine dependence and alcohol dependence.  MENTAL STATUS EXAMINATION:  General - the patient was alert and oriented x3 and was friendly and cooperative with this examiner.  Speech was appropriate in terms of rate and volume.  Mood appeared essentially euthymic today.  Affect was bright and full, but slightly labile. Thoughts -  the patient adamantly denied any suicidal or homicidal ideations nor does she report any current auditory or visual hallucinations or delusional thinking.  Judgment and insight today both appeared fair to good.  IMPRESSION:   AXIS I:  Schizoaffective disorder - depressed type - currently under fair to good control. Alcohol dependence - reportedly in remission. Cocaine dependence - currently in remission. Cannabis abuse - episodic. Nicotine dependence. AXIS II:  Deferred. AXIS III:  Please see past medical history above. AXIS IV:  Serious chronic health issues.  Chronic mental illness. Unemployment. AXIS V:  Global Assessment of Functioning at time of admission approximately 35.  I have Global Assessment of Functioning in past year approximately 55.  PLAN: 1.  Since the patient states that she felt most stable when she took     her last medications whic were prescribed prior to discharge from     behavioral health; I would recommend restarting these medications. 2. This would be amantadine 100 mg p.o. b.i.d. as prescribed for     cocaine grading. 3. Although the patient is taking 20 mg of Prozac, I would increase     this to Prozac 40 mg p.o. q.a.m. to address her depressive     symptoms. 4. I would restart the medications of Seroquel 200 mg p.o. q.7 a.m.     and 2 p.m. and 400 mg p.o. q.10 p.m.  This would be restarted to     address the patient's auditory hallucinations as well as provide     mood stabilization.  The patient states that her hallucinations are     resolved when she takes this medication. 5. Restart Ambien 10 mg p.o. at bedtime - p.r.n. for sleep. 6. Discontinue Xanax as this may result in this condition. 7. Since the patient and her boyfriend state that one-to-one is not     necessary, I would recommend a one-to-one be discontinued and the     patient be placed on q.15 minutes checks. 8.  The patient should be able to be discharged once medically stable.      However, I would strongly recommend followup appointments be made at      [facility] [phone]) prior to discharge.  Also, the patient's ACT team      should be notified of her hospitalization. 9. Please reconsult if any concerns arise prior to discharge.   ___________________________________ Franchot Gallo, MD     RR/MEDQ  D:  07/26/2010  T:  07/26/2010  Job:  [id number]  Electronically Signed by Franchot Gallo MD on 07/27/2010 09:52:54 PM

## 2010-07-31 ENCOUNTER — Emergency Department (HOSPITAL_COMMUNITY): Payer: Medicaid Other

## 2010-07-31 ENCOUNTER — Emergency Department (HOSPITAL_COMMUNITY)
Admission: EM | Admit: 2010-07-31 | Discharge: 2010-08-06 | Disposition: A | Discharge status: Home / Self Care | Payer: Medicaid Other | Attending: Emergency Medicine | Admitting: Emergency Medicine

## 2010-07-31 DIAGNOSIS — Z21 Asymptomatic human immunodeficiency virus [HIV] infection status: Secondary | ICD-10-CM | POA: Insufficient documentation

## 2010-07-31 DIAGNOSIS — S61409A Unspecified open wound of unspecified hand, initial encounter: Secondary | ICD-10-CM | POA: Insufficient documentation

## 2010-07-31 DIAGNOSIS — R45851 Suicidal ideations: Secondary | ICD-10-CM | POA: Insufficient documentation

## 2010-07-31 DIAGNOSIS — S58119A Complete traumatic amputation at level between elbow and wrist, unspecified arm, initial encounter: Secondary | ICD-10-CM | POA: Insufficient documentation

## 2010-07-31 DIAGNOSIS — F1010 Alcohol abuse, uncomplicated: Secondary | ICD-10-CM | POA: Insufficient documentation

## 2010-07-31 DIAGNOSIS — F3130 Bipolar disorder, current episode depressed, mild or moderate severity, unspecified: Secondary | ICD-10-CM | POA: Insufficient documentation

## 2010-07-31 DIAGNOSIS — Z79899 Other long term (current) drug therapy: Secondary | ICD-10-CM | POA: Insufficient documentation

## 2010-07-31 DIAGNOSIS — I10 Essential (primary) hypertension: Secondary | ICD-10-CM | POA: Insufficient documentation

## 2010-07-31 LAB — COMPREHENSIVE METABOLIC PANEL
ALT: 29 U/L (ref 0–35)
Alkaline Phosphatase: 101 U/L (ref 39–117)
BUN: 17 mg/dL (ref 6–23)
CO2: 21 mEq/L (ref 19–32)
GFR calc Af Amer: >60 mL/min (ref >60)
GFR calc non Af Amer: >60 mL/min (ref >60)
Glucose, Bld: 121 mg/dL — ABNORMAL HIGH (ref 70–99)
Potassium: 3.3 mEq/L — ABNORMAL LOW (ref 3.5–5.1)
Sodium: 137 mEq/L (ref 135–145)
Total Bilirubin: 0.3 mg/dL (ref 0.3–1.2)
Total Protein: 7.2 g/dL (ref 6.0–8.3)

## 2010-07-31 LAB — DIFFERENTIAL
Basophils Relative: 1 % (ref 0–1)
Eosinophils Relative: 3 % (ref 0–5)
Lymphs Abs: 5 10*3/uL — ABNORMAL HIGH (ref 0.7–4.0)
Monocytes Absolute: 0.9 10*3/uL (ref 0.1–1.0)
Monocytes Relative: 10 % (ref 3–12)
Neutrophils Relative %: 31 % — ABNORMAL LOW (ref 43–77)

## 2010-07-31 LAB — CBC
HCT: 32.1 % — ABNORMAL LOW (ref 36.0–46.0)
Hemoglobin: 11 g/dL — ABNORMAL LOW (ref 12.0–15.0)
MCV: 77.9 fL — ABNORMAL LOW (ref 78.0–100.0)
RDW: 17.4 % — ABNORMAL HIGH (ref 11.5–15.5)
WBC: 9.1 10*3/uL (ref 4.0–10.5)

## 2010-07-31 LAB — ETHANOL: Alcohol, Ethyl (B): 187 mg/dL — ABNORMAL HIGH (ref 0–11)

## 2010-08-01 LAB — RAPID URINE DRUG SCREEN, HOSP PERFORMED
Amphetamines: NOT DETECTED
Barbiturates: NOT DETECTED
Tetrahydrocannabinol: POSITIVE — AB

## 2010-08-04 NOTE — Discharge Summary (Signed)
[name], [name] NO.:  [id number]  MEDICAL RECORD NO.:  [id number]  LOCATION:  3017                         FACILITY:  [facility]  PHYSICIAN:  Charlcie Cradle. Delford Field, MD, FCCPDATE OF BIRTH:  11[birth year]  DATE OF ADMISSION:  07/24/2010 DATE OF DISCHARGE:  07/27/2010                              DISCHARGE SUMMARY   Discharge diagnoses consist of: 1. Acute respiratory failure secondary to overdose. 2. Altered mental status secondary to overdose. 3. Human immunodeficiency virus positive.  HISTORY OF PRESENT ILLNESS:  [name] is a 45 year old African American female who was intubated in the emergency department with decreased level of consciousness after ingesting an intentional overdose of Seroquel.  This is a recurrent suicide attempt.  She was recently discharged from [facility] on July 10, 2010.  LINES AND TUBES:  She had an endotracheal tube from July 24, 2010, to July 25, 2010.  PROTOCOL:  She had sliding scale insulin from July 24, 2010, to July 25, 2010, and sedation protocol from July 24, 2010, to July 25, 2010.  LABORATORY DATA:  Hemoglobin 9.7, hematocrit 28.8, platelets 304, WBC 7.2.  Sodium 138, potassium 3.3, chloride is 105, CO2 is 21, BUN is 12, creatinine is 0.8, glucose is 100.  Albumin is 2.9, AST is 34, ALT is 37, alkaline phosphatase 89, total bilirubin 0.3, calcium 7.4.  Blood culture showed no growth.  Respiratory showed no growth.  Urine culture was negative.  RADIOGRAPHIC DATA:  Chest x-ray demonstrates improved aeration and persistent bilateral airspace disease, exacerbation is noted.  HOSPITAL COURSE BY DISCHARGE DIAGNOSES: 1. Acute respiratory failure secondary to overdose.  She was extubated     within 24 hours.  Chest x-ray was unremarkable.  She did not     require antimicrobial therapy.  She reached maximal hospital     benefit by July 27, 2010, and after being cleared by Psychiatry and     not felt to be an imminent  danger to herself or others, she was     discharged home. 2. Altered mental status.  This resolved with the effects of the     medication wearing off. 3. HIV positive with no change. 4. Suicide attempt.  Again, she was evaluated by Psychiatry and did     not feel to be an imminent danger to herself or others.  She will be discharged on her previous home medication regimen of: 1. Amantadine 100 mg twice daily. 2. Prozac 20 mg tablet 40 mg by mouth daily. 3. Seroquel 200 mg, she used to take 1 at 7 a.m. and 2 p.m. and 2 at     10:00 p.m. daily. 4. Ambien 10 mg at bedtime p.r.n. 5. She is also to utilize nicotine patches and refrain from smoking.  DIET:  Heart-healthy diet.  DISPOSITION/CONDITION ON DISCHARGE:  Improved.  There is no need for her to follow up with Pulmonary Critical Care.  She has been instructed to follow up with  [facility] at [phone] and to call for appointment.  Note that her home cell phone number is [phone].  We will give her a call on July 28, 2010, to make sure she  has followed up with that appointment.  She is being discharged in improved condition.     Devra Dopp, MSN, ACNP   ______________________________ Charlcie Cradle. Delford Field, MD, FCCP    SM/MEDQ  D:  07/27/2010  T:  07/28/2010  Job:  [id number]  cc:   Franchot Gallo, MD  Electronically Signed by Devra Dopp MSN ACNP on 08/04/2010 03:54:32 PM Electronically Signed by Shan Levans MD FCCP on 08/04/2010 08:22:15 PM

## 2010-08-29 ENCOUNTER — Emergency Department (HOSPITAL_COMMUNITY)
Admission: EM | Admit: 2010-08-29 | Discharge: 2010-09-02 | Disposition: A | Discharge status: Other Acute Inpatient Hospital | Payer: Medicaid Other | Source: Home / Self Care | Attending: Emergency Medicine | Admitting: Emergency Medicine

## 2010-08-29 ENCOUNTER — Emergency Department (HOSPITAL_COMMUNITY): Payer: Medicaid Other

## 2010-08-29 DIAGNOSIS — I498 Other specified cardiac arrhythmias: Secondary | ICD-10-CM | POA: Insufficient documentation

## 2010-08-29 DIAGNOSIS — T398X2A Poisoning by other nonopioid analgesics and antipyretics, not elsewhere classified, intentional self-harm, initial encounter: Secondary | ICD-10-CM | POA: Insufficient documentation

## 2010-08-29 DIAGNOSIS — R0789 Other chest pain: Secondary | ICD-10-CM | POA: Insufficient documentation

## 2010-08-29 DIAGNOSIS — F319 Bipolar disorder, unspecified: Secondary | ICD-10-CM | POA: Insufficient documentation

## 2010-08-29 DIAGNOSIS — T394X2A Poisoning by antirheumatics, not elsewhere classified, intentional self-harm, initial encounter: Secondary | ICD-10-CM | POA: Insufficient documentation

## 2010-08-29 DIAGNOSIS — I10 Essential (primary) hypertension: Secondary | ICD-10-CM | POA: Insufficient documentation

## 2010-08-29 DIAGNOSIS — T39314A Poisoning by propionic acid derivatives, undetermined, initial encounter: Secondary | ICD-10-CM | POA: Insufficient documentation

## 2010-08-29 DIAGNOSIS — F209 Schizophrenia, unspecified: Secondary | ICD-10-CM | POA: Insufficient documentation

## 2010-08-30 LAB — RAPID URINE DRUG SCREEN, HOSP PERFORMED
Amphetamines: POSITIVE — AB
Barbiturates: NOT DETECTED
Cocaine: NOT DETECTED
Opiates: NOT DETECTED
Tetrahydrocannabinol: NOT DETECTED

## 2010-08-30 LAB — DIFFERENTIAL
Basophils Absolute: 0 10*3/uL (ref 0.0–0.1)
Basophils Relative: 0 % (ref 0–1)
Lymphocytes Relative: 39 % (ref 12–46)
Monocytes Absolute: 0.9 10*3/uL (ref 0.1–1.0)
Neutro Abs: 4.4 10*3/uL (ref 1.7–7.7)
Neutrophils Relative %: 49 % (ref 43–77)

## 2010-08-30 LAB — COMPREHENSIVE METABOLIC PANEL
ALT: 20 U/L (ref 0–35)
Albumin: 4.2 g/dL (ref 3.5–5.2)
Alkaline Phosphatase: 85 U/L (ref 39–117)
BUN: 11 mg/dL (ref 6–23)
Chloride: 98 mEq/L (ref 96–112)
GFR calc Af Amer: >60 mL/min (ref >60)
Glucose, Bld: 96 mg/dL (ref 70–99)
Potassium: 3.6 mEq/L (ref 3.5–5.1)
Sodium: 134 mEq/L — ABNORMAL LOW (ref 135–145)
Total Bilirubin: 0.2 mg/dL — ABNORMAL LOW (ref 0.3–1.2)
Total Protein: 8 g/dL (ref 6.0–8.3)

## 2010-08-30 LAB — CBC
HCT: 36 % (ref 36.0–46.0)
Hemoglobin: 12.3 g/dL (ref 12.0–15.0)
MCHC: 34.2 g/dL (ref 30.0–36.0)
WBC: 8.8 10*3/uL (ref 4.0–10.5)

## 2010-08-30 LAB — URINALYSIS, ROUTINE W REFLEX MICROSCOPIC
Glucose, UA: NEGATIVE mg/dL
Ketones, ur: 15 mg/dL — AB
Leukocytes, UA: NEGATIVE
Protein, ur: 30 mg/dL — AB
Urobilinogen, UA: 1 mg/dL (ref 0.0–1.0)

## 2010-09-01 LAB — D-DIMER, QUANTITATIVE: D-Dimer, Quant: 0.48 ug/mL-FEU (ref 0.00–0.48)

## 2010-09-02 ENCOUNTER — Inpatient Hospital Stay (HOSPITAL_COMMUNITY)
Admission: AD | Admit: 2010-09-02 | Discharge: 2010-09-05 | DRG: 885 | Disposition: A | Discharge status: Home / Self Care | Payer: Medicaid Other | Source: Ambulatory Visit | Attending: Psychiatry | Admitting: Psychiatry

## 2010-09-02 DIAGNOSIS — F1420 Cocaine dependence, uncomplicated: Secondary | ICD-10-CM

## 2010-09-02 DIAGNOSIS — I10 Essential (primary) hypertension: Secondary | ICD-10-CM

## 2010-09-02 DIAGNOSIS — S48119A Complete traumatic amputation at level between unspecified shoulder and elbow, initial encounter: Secondary | ICD-10-CM

## 2010-09-02 DIAGNOSIS — R45851 Suicidal ideations: Secondary | ICD-10-CM

## 2010-09-02 DIAGNOSIS — E119 Type 2 diabetes mellitus without complications: Secondary | ICD-10-CM

## 2010-09-02 DIAGNOSIS — Z21 Asymptomatic human immunodeficiency virus [HIV] infection status: Secondary | ICD-10-CM

## 2010-09-02 DIAGNOSIS — F259 Schizoaffective disorder, unspecified: Principal | ICD-10-CM

## 2010-09-04 NOTE — Assessment & Plan Note (Signed)
NAMETERRISA, [name]                 ACCOUNT NO.:  [id number]  MEDICAL RECORD NO.:  [id number]  LOCATION:                                 FACILITY:  PHYSICIAN:  Eulogio Ditch, MD DATE OF BIRTH:  [birth year]  DATE OF ADMISSION:  09/01/2010 DATE OF DISCHARGE:                      PSYCHIATRIC ADMISSION ASSESSMENT   DATE OF THE ASSESSMENT:  September 02, 2010, at 1645  IDENTIFYING INFORMATION:  A 45 year old female.  This is a voluntary admission.  HISTORY OF PRESENT ILLNESS:  This is one of several [facility] admissions for [name], a 45 year old [place] American female, most recently in our unit June 6th to the 13th and previous admissions in September and November 2011.  She presents complaining of hearing voices for the past 2 weeks telling her to harm herself.  She sys she has been having a lot of problems with anger and getting deeply depressed.  Did not have a plan for suicide but complains of aggravating factors including wanting to move out of her mother's house and chronic financial pressures.  She has a history of cocaine abuse but denies relapsing on cocaine.  She endorses a lot of anger issues and is asking for help with her anger.  PAST PSYCHIATRIC HISTORY:  [name] has a history of multiple admissions at [facility] dating the past several years.  She has a history of polysubstance abuse and was most recently on our medical unit on June 28th to July 1st with acute respiratory failure and altered mental status secondary to overdose.  This was after an intentional overdose of Seroquel.  She has been followed by [facility].  They report she has been unwilling to attend their substance abuse programs.  Has a history of drinking some alcohol, and the longest period sober is unclear.  SOCIAL HISTORY:  This is a single [place] American female.  No children. Lives with her mother in [place].  She has a history of relationship with a boyfriend, and she reports they are on and  off again.  She is currently unemployed.  She is disabled due to schizoaffective disorder. She denies any current legal problems.  MEDICAL PROBLEMS: 1. History of schizoaffective disorder. 2. HIV positivity. 3. History of above-the-elbow left arm amputation.  CURRENT MEDICATIONS:  Seroquel 200 mg p.o. t.i.d.  DRUG ALLERGIES: 1. LORAZEPAM. 2. LIBRIUM. 3. SHE ALSO REPORTS THAT THORAZINE HAS CAUSED HER TO HAVE ACUTE     DYSTONIC REACTION.  PHYSICAL EXAMINATION:  Was done in the emergency room and is noted in the record.  This is an overweight African American female in no acute physical distress today. She is 5 feet 4 inches tall, weighs 92 kg. ADMITTING VITAL SIGNS:  Temperature 98, pulse 94, respirations 18, blood pressure 125/90.  She was treated with 1 mg of lorazepam twice in the emergency room and received her Seroquel 200 mg t.i.d. while there awaiting a bed at [facility]. She did have a lithium level which was less than 0.25, although she has not taken lithium recently.  D-dimer negative.  Urine drug screen positive for amphetamines and benzodiazepines.  Urine pregnancy test was negative.  Comprehensive metabolic panel normal with BUN 11, creatinine 0.95.  Alcohol level 31.  MENTAL STATUS EXAMINATION:  A fully alert female, cooperative, mildly irritable in mood, complaining of having a lot of problems with anger. I feel that she is not being completely candid with me about her use of drugs and substances recently.  Denies relapsing on cocaine, is evasive about how much alcohol she has been drinking.  Faces an upcoming court date at the end of the month for reasons that are unclear and cites this as a stressor along with financial stressors and wanting to move out of her mother's house which has been a chronic issue for more than a year. Insight is limited.  Judgment fair.  Impulse control fair.  She does appear internally distracted and is complaining of  auditory hallucinations to harm herself.  Axis I: 1. Schizoaffective disorder. 2. Cocaine dependence. Axis II:  No diagnosis. Axis III:  Left above-the-elbow amputation, well-healed. Axis IV:  Significant chronic social issues and domestic stressors. Axis V:  Current is 44, past year not known.  The plan is to voluntarily admit her with a goal of alleviating her suicidal thoughts.  We talked about various options with controlling the auditory hallucinations and her anger.  We have restarted her on her Seroquel at 200 mg t.i.d. and h.s., and she has been on Tegretol and Depakote in the past to control the auditory hallucinations.  We will start her back on Tegretol 200 mg b.i.d.  we have discussed this with [name], and she is in agreement with the plan.     Margaret A. [name], N.P.   ______________________________ Eulogio Ditch, MD    MAS/MEDQ  D:  09/02/2010  T:  09/03/2010  Job:  [id number]  Electronically Signed by Kari Baars N.P. on 09/03/2010 02:18:08 PM Electronically Signed by Eulogio Ditch  on 09/04/2010 08:57:26 AM

## 2010-09-11 NOTE — Discharge Summary (Signed)
[name], STANDRE NO.:  [id number]  MEDICAL RECORD NO.:  [id number]  LOCATION:  1610                          FACILITY:  [facility]  PHYSICIAN:  Eulogio Ditch, MD DATE OF BIRTH:  Nov 19, 1965  DATE OF ADMISSION:  09/02/2010 DATE OF DISCHARGE:  09/05/2010                              DISCHARGE SUMMARY   IDENTIFYING INFORMATION:  A 45 year old female, African American, single.  This is a voluntary admission.  HISTORY OF PRESENT ILLNESS:  This was one of several admissions for [name], a 45 year old who was most recently on our unit July 02, 2010, to July 09, 2010, and also previous admissions in September and November 2011.  She presented with complaints of hearing voices for the past 2 weeks telling her to harm herself.  She said she had had a lot of problems with anger and getting deeply depressed.  She did not have a specific plan for suicide, but complained of a lot of aggravating factors, including not getting along with her mother and wanting to move out of her house and chronic financial pressures.  She has a history of cocaine abuse and dependence, but had not relapsed on cocaine since her previous discharge from [facility] on July 09, 2010.  She was asking for help with her anger and trying to make the voices stop.  She is currently followed as an outpatient by [facility] team.  She has a history of drinking alcohol also, but denied any recent alcohol abuse. The longest period sober is unclear.  MEDICAL EVALUATION:  A full physical exam was done in the emergency room.  This is an overweight African American female who presented in no acute physical distress.  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  She had a history of allergic reactions to lorazepam, Librium, and Thorazine had caused acute dystonic reaction in the past.  She presented afebrile with pulse 94, respirations 18, blood pressure 125/90.   She received 1 mg of lorazepam twice in the emergency room and Seroquel 200 mg t.i.d., her previous home meds, while there awaiting a bed at [facility].  Lithium level was checked and noted to be less than 0.25, although she had not taken any lithium recently.  Urine drug screen positive for amphetamines and benzodiazepines.  Urine pregnancy test was negative.  Comprehensive metabolic panel normal with a BUN of 11, creatinine 0.95.  Alcohol screen 31 mg/dL.  COURSE OF HOSPITALIZATION:  She was admitted to our acute stabilization unit and initially presented with irritable mood, although cooperative and directable, complaining about loud voices.  She denied relapsing on cocaine, but was evasive about her alcohol use.  She has a history of chronic relationship stressors with a boyfriend, but was evasive about his role in any of her stressors.  Insight appeared limited.  Impulse control fair to poor.  Judgment fair to poor.  She did appear internally distracted and was complaining of auditory hallucinations telling her to harm herself.  She was started back on her Seroquel 200 mg t.i.d. and h.s. and had also been on Tegretol and Depakote in the past to control her anger,  irritability, and mood swings.  She was interested in starting back on one of those, feeling that the Seroquel was not holding her completely and we elected to start her back on Tegretol 200 mg p.o. b.i.d.  She later admitted in discussion with our case manager that her boyfriend of 3 years had broken up with her and she agreed to referrals to support groups for grief and loss.  She admitted that she needed to stop using alcohol and substances and stop hanging out with "the wrong people." Her participation in group therapy was good while here on the unit and interactions with peers and staff were appropriate.  By September 03, 2010, she was feeling less depressed and was still having some auditory hallucinations, but they were  non-command type.  Anxiety was much less, affect a bit broader.  She refused help getting placed at a [facility] or an assisted living home, but wanted to find her own apartment, wanted to the back out in the community and in control of her own paycheck.  She gave [place] permission to speak with her mother, who accepted the suicide prevention information and expressed her support for the patient and by September 05, 2010, she was feeling much better.  No more auditory hallucinations, no dangerous thoughts, and ready for outpatient treatment.  She verbally expressed her commitment to attending the [facility] substance abuse treatment groups.  She was tolerating medications well.  DISCHARGE DIAGNOSES:  Axis I:  Schizoaffective disorder, cocaine dependence. Axis II:  No diagnosis. Axis III:  Left above-elbow amputation, well healed. Axis IV:  Significant social issues. Axis V:  Current 57, past year not known.  FOLLOWUP PLANS:  Follow up with her [facility] team on Friday, September 05, 2010, at 12 noon.  DISCHARGE MEDICATIONS: 1. Carbamazepine 200 mg q.a.m. and q.h.s. for mood stability. 2. Quetiapine 200 mg 4 times daily for auditory hallucinations. 3. Zolpidem 5 mg h.s. p.r.n. insomnia.     Margaret A. [name], N.P.   ______________________________ Eulogio Ditch, MD    MAS/MEDQ  D:  09/09/2010  T:  09/10/2010  Job:  [id number]  Electronically Signed by Kari Baars N.P. on 09/11/2010 08:28:16 AM Electronically Signed by Eulogio Ditch  on 09/11/2010 08:58:10 AM

## 2010-09-16 ENCOUNTER — Emergency Department (HOSPITAL_COMMUNITY)
Admission: EM | Admit: 2010-09-16 | Discharge: 2010-09-16 | Discharge status: Against Medical Advice | Payer: Medicaid Other | Attending: Emergency Medicine | Admitting: Emergency Medicine

## 2010-09-16 ENCOUNTER — Emergency Department (HOSPITAL_COMMUNITY)
Admission: EM | Admit: 2010-09-16 | Discharge: 2010-09-16 | Disposition: A | Discharge status: Home / Self Care | Payer: Medicaid Other | Source: Home / Self Care | Attending: Emergency Medicine | Admitting: Emergency Medicine

## 2010-09-16 ENCOUNTER — Emergency Department: Admission: EM | Admit: 2010-09-16 | Payer: Self-pay

## 2010-09-16 DIAGNOSIS — F1910 Other psychoactive substance abuse, uncomplicated: Secondary | ICD-10-CM | POA: Insufficient documentation

## 2010-09-16 DIAGNOSIS — R109 Unspecified abdominal pain: Secondary | ICD-10-CM | POA: Insufficient documentation

## 2010-09-16 DIAGNOSIS — I10 Essential (primary) hypertension: Secondary | ICD-10-CM | POA: Insufficient documentation

## 2010-09-24 ENCOUNTER — Other Ambulatory Visit (INDEPENDENT_AMBULATORY_CARE_PROVIDER_SITE_OTHER): Payer: Medicaid Other

## 2010-09-24 DIAGNOSIS — Z113 Encounter for screening for infections with a predominantly sexual mode of transmission: Secondary | ICD-10-CM

## 2010-09-24 DIAGNOSIS — B20 Human immunodeficiency virus [HIV] disease: Secondary | ICD-10-CM

## 2010-09-24 DIAGNOSIS — Z79899 Other long term (current) drug therapy: Secondary | ICD-10-CM

## 2010-09-24 LAB — LIPID PANEL
LDL Cholesterol: 132 mg/dL — ABNORMAL HIGH (ref 0–99)
Triglycerides: 94 mg/dL (ref <150)
VLDL: 19 mg/dL (ref 0–40)

## 2010-09-25 LAB — CBC WITH DIFFERENTIAL/PLATELET
Eosinophils Relative: 3 % (ref 0–5)
HCT: 37.8 % (ref 36.0–46.0)
Lymphocytes Relative: 52 % — ABNORMAL HIGH (ref 12–46)
Lymphs Abs: 3.1 10*3/uL (ref 0.7–4.0)
MCV: 80.8 fL (ref 78.0–100.0)
Monocytes Absolute: 0.5 10*3/uL (ref 0.1–1.0)
RBC: 4.68 MIL/uL (ref 3.87–5.11)
WBC: 6 10*3/uL (ref 4.0–10.5)

## 2010-09-25 LAB — COMPLETE METABOLIC PANEL WITH GFR
BUN: 13 mg/dL (ref 6–23)
CO2: 23 mEq/L (ref 19–32)
Calcium: 9.4 mg/dL (ref 8.4–10.5)
Chloride: 105 mEq/L (ref 96–112)
Creat: 0.87 mg/dL (ref 0.50–1.10)
GFR, Est African American: >60 mL/min (ref >60)
Glucose, Bld: 74 mg/dL (ref 70–99)

## 2010-09-25 LAB — GC/CHLAMYDIA PROBE AMP, URINE
Chlamydia, Swab/Urine, PCR: NEGATIVE
GC Probe Amp, Urine: NEGATIVE

## 2010-09-26 LAB — T-HELPER CELL (CD4) - (RCID CLINIC ONLY): CD4 % Helper T Cell: 44 % (ref 33–55)

## 2010-10-09 ENCOUNTER — Ambulatory Visit: Payer: Medicaid Other | Admitting: Internal Medicine

## 2010-10-16 LAB — T-HELPER CELL (CD4) - (RCID CLINIC ONLY): CD4 T Cell Abs: 1390

## 2010-10-17 ENCOUNTER — Emergency Department (HOSPITAL_COMMUNITY)
Admission: EM | Admit: 2010-10-17 | Discharge: 2010-10-17 | Disposition: A | Discharge status: Home / Self Care | Payer: Medicaid Other | Attending: Emergency Medicine | Admitting: Emergency Medicine

## 2010-10-17 DIAGNOSIS — Z79899 Other long term (current) drug therapy: Secondary | ICD-10-CM | POA: Insufficient documentation

## 2010-10-17 DIAGNOSIS — R259 Unspecified abnormal involuntary movements: Secondary | ICD-10-CM | POA: Insufficient documentation

## 2010-10-17 DIAGNOSIS — F209 Schizophrenia, unspecified: Secondary | ICD-10-CM | POA: Insufficient documentation

## 2010-10-17 DIAGNOSIS — F319 Bipolar disorder, unspecified: Secondary | ICD-10-CM | POA: Insufficient documentation

## 2010-10-17 DIAGNOSIS — K148 Other diseases of tongue: Secondary | ICD-10-CM | POA: Insufficient documentation

## 2010-10-23 LAB — RAPID URINE DRUG SCREEN, HOSP PERFORMED
Amphetamines: NOT DETECTED
Benzodiazepines: POSITIVE — AB
Cocaine: POSITIVE — AB
Opiates: NOT DETECTED
Tetrahydrocannabinol: NOT DETECTED

## 2010-10-23 LAB — CBC
Hemoglobin: 12.2
MCHC: 33.6
RBC: 4.5

## 2010-10-23 LAB — BASIC METABOLIC PANEL
CO2: 21
Calcium: 8.9
GFR calc Af Amer: >60
GFR calc non Af Amer: >60
Sodium: 136

## 2010-11-04 LAB — COMPREHENSIVE METABOLIC PANEL
Albumin: 3.2 — ABNORMAL LOW
Alkaline Phosphatase: 84
Alkaline Phosphatase: 86
BUN: 14
BUN: 9
CO2: 22
Calcium: 8.6
GFR calc non Af Amer: >60
Glucose, Bld: 103 — ABNORMAL HIGH
Potassium: 3.7
Potassium: 3.9
Sodium: 139
Total Protein: 6.4
Total Protein: 7.2

## 2010-11-04 LAB — CBC
HCT: 34.2 — ABNORMAL LOW
HCT: 35.8 — ABNORMAL LOW
Hemoglobin: 11.3 — ABNORMAL LOW
Hemoglobin: 12.1
MCHC: 33.1
MCHC: 33.8
MCV: 80.9
Platelets: 309
Platelets: 369
RBC: 4.23
RBC: 4.53
RDW: 15.9 — ABNORMAL HIGH
RDW: 16 — ABNORMAL HIGH
RDW: 16.4 — ABNORMAL HIGH
WBC: 8.2

## 2010-11-04 LAB — URINALYSIS, ROUTINE W REFLEX MICROSCOPIC
Bilirubin Urine: NEGATIVE
Bilirubin Urine: NEGATIVE
Glucose, UA: NEGATIVE
Glucose, UA: NEGATIVE
Hgb urine dipstick: NEGATIVE
Hgb urine dipstick: NEGATIVE
Ketones, ur: NEGATIVE
Nitrite: NEGATIVE
Protein, ur: NEGATIVE
Specific Gravity, Urine: 1.018
Specific Gravity, Urine: 1.025
Urobilinogen, UA: 0.2
pH: 5.5
pH: 7

## 2010-11-04 LAB — RAPID URINE DRUG SCREEN, HOSP PERFORMED
Amphetamines: NOT DETECTED
Barbiturates: NOT DETECTED
Barbiturates: NOT DETECTED
Benzodiazepines: POSITIVE — AB
Cocaine: POSITIVE — AB
Opiates: NOT DETECTED
Opiates: NOT DETECTED
Tetrahydrocannabinol: NOT DETECTED

## 2010-11-04 LAB — URINE MICROSCOPIC-ADD ON

## 2010-11-04 LAB — BASIC METABOLIC PANEL
CO2: 26
Calcium: 8.6
Chloride: 102
Glucose, Bld: 102 — ABNORMAL HIGH
Sodium: 138

## 2010-11-04 LAB — BASIC METABOLIC PANEL WITH GFR
BUN: 10
Creatinine, Ser: 0.93
GFR calc Af Amer: >60
GFR calc non Af Amer: >60
Potassium: 3.9

## 2010-11-04 LAB — TSH: TSH: 1.234

## 2010-11-04 LAB — DIFFERENTIAL
Basophils Absolute: 0
Basophils Absolute: 0.1
Basophils Relative: 0
Basophils Relative: 1
Eosinophils Absolute: 0.3
Eosinophils Relative: 4
Lymphocytes Relative: 51 — ABNORMAL HIGH
Lymphs Abs: 4.2 — ABNORMAL HIGH
Monocytes Absolute: 0.8 — ABNORMAL HIGH
Monocytes Relative: 10
Neutro Abs: 2.8
Neutro Abs: 6
Neutrophils Relative %: 35 — ABNORMAL LOW
Neutrophils Relative %: 65

## 2010-11-04 LAB — SALICYLATE LEVEL: Salicylate Lvl: <4

## 2010-11-04 LAB — LACTATE DEHYDROGENASE: LDH: 192

## 2010-11-05 LAB — I-STAT 8, (EC8 V) (CONVERTED LAB)
Acid-Base Excess: 2
Bicarbonate: 27 — ABNORMAL HIGH
HCT: 39
Hemoglobin: 13.3
Operator id: 146091
Potassium: 4.2
Sodium: 141
TCO2: 28

## 2010-11-05 LAB — DIFFERENTIAL
Basophils Absolute: 0
Eosinophils Relative: 2
Lymphocytes Relative: 44
Lymphs Abs: 3.1
Monocytes Absolute: 0.5
Neutro Abs: 3.2

## 2010-11-05 LAB — BASIC METABOLIC PANEL
Calcium: 8.9
GFR calc non Af Amer: >60
Glucose, Bld: 150 — ABNORMAL HIGH
Potassium: 3.9
Sodium: 140

## 2010-11-05 LAB — CBC
HCT: 36.5
Hemoglobin: 11.9 — ABNORMAL LOW
Platelets: 363
RDW: 15.9 — ABNORMAL HIGH
WBC: 6.9

## 2010-11-05 LAB — POCT I-STAT CREATININE: Operator id: 146091

## 2010-11-06 LAB — RAPID URINE DRUG SCREEN, HOSP PERFORMED
Benzodiazepines: NOT DETECTED
Cocaine: POSITIVE — AB
Opiates: NOT DETECTED
Tetrahydrocannabinol: NOT DETECTED

## 2010-11-06 LAB — BASIC METABOLIC PANEL
Calcium: 9.1
GFR calc Af Amer: >60
GFR calc non Af Amer: >60
Glucose, Bld: 87
Potassium: 3.9
Sodium: 138

## 2010-11-06 LAB — COMPREHENSIVE METABOLIC PANEL
AST: 24
BUN: 16
CO2: 25
Calcium: 8.9
Chloride: 100
Creatinine, Ser: 1.08
GFR calc non Af Amer: 56 — ABNORMAL LOW
Glucose, Bld: 153 — ABNORMAL HIGH
Total Bilirubin: 0.5

## 2010-11-06 LAB — CBC
Hemoglobin: 12.3
RDW: 15.3 — ABNORMAL HIGH
WBC: 8.7

## 2010-11-06 LAB — URINALYSIS, ROUTINE W REFLEX MICROSCOPIC
Bilirubin Urine: NEGATIVE
Hgb urine dipstick: NEGATIVE
Ketones, ur: NEGATIVE
Specific Gravity, Urine: 1.021
Urobilinogen, UA: 0.2
pH: 7

## 2010-11-06 LAB — ETHANOL: Alcohol, Ethyl (B): <5

## 2010-11-10 LAB — T-HELPER CELL (CD4) - (RCID CLINIC ONLY)
CD4 % Helper T Cell: 35
CD4 T Cell Abs: 950

## 2010-11-20 ENCOUNTER — Ambulatory Visit (INDEPENDENT_AMBULATORY_CARE_PROVIDER_SITE_OTHER): Payer: Medicaid Other | Admitting: Internal Medicine

## 2010-11-20 ENCOUNTER — Encounter: Payer: Self-pay | Admitting: Internal Medicine

## 2010-11-20 ENCOUNTER — Telehealth: Payer: Self-pay | Admitting: *Deleted

## 2010-11-20 DIAGNOSIS — B20 Human immunodeficiency virus [HIV] disease: Secondary | ICD-10-CM

## 2010-11-20 DIAGNOSIS — Z79899 Other long term (current) drug therapy: Secondary | ICD-10-CM

## 2010-11-20 NOTE — Assessment & Plan Note (Signed)
He comes in today to reestablish care her CD4 count is 1500 with a viral load little over 1000. 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 I did discuss with her the concern with long term viral activity on cardiac and renal function. Though she has done well without treatment with his good CD4 count I did talk to her about the effects of HIV in general. I did discuss with her that at this time many providers like myself are offering treatment to people like her because the potential that her HIV virus will become more active. I did explain at length that the long-term effects of HIV are believed to be significant even at this low level. At this time though the patient would like to continue to defer treatment and be followed saw him only periodically to assure that he is continually at a good CD4 count and low viral load. Therefore this time I will not start her on therapy and continue to monitor her laboratory values. Additionally he did talk to her about exercise and weight loss. Additionally we'll have her referred for a Pap smear and mammography. Her LDL also is mildly elevated at 132 and this will be followed and she is going to try diet and exercise and this will be checked again in 6 months.

## 2010-11-20 NOTE — Progress Notes (Signed)
  Subjective:    Patient ID: [name], female    DOB: August 01, 1965, 45 y.o.   MRN: [id number]  HPIthis patient comes in today after not having been chronic for several years. She states she has been in [place] since that time, though she has not been followed by an HIV provider. She tells me she has had HIV for 12 years and has never been on treatment. She has had a reassuring CD4 count and viral load that has not been significantly elevated during that time. She has no history of opportunistic infections and denies any history of gonorrhea, Chlamydia, syphilis or other STI's. Pap smear has been greater than one year ago. Her only medications are related to her bipolar disorder. Today she has no specific complaints including no dysphasia, weight loss, diarrhea, constipation chest pain dysuria or pyuria.    Review of Systems  Constitutional: Negative for fever, activity change, fatigue and unexpected weight change.  HENT: Negative for neck pain.   Eyes: Negative for discharge.  Respiratory: Negative for cough and shortness of breath.   Cardiovascular: Negative for chest pain and palpitations.  Gastrointestinal: Negative for nausea, vomiting, abdominal pain, diarrhea and constipation.  Genitourinary: Negative for urgency, frequency and hematuria.  Musculoskeletal: Negative for myalgias and arthralgias.  Skin: Negative for pallor and rash.  Neurological: Negative for headaches.  Hematological: Negative for adenopathy.  Psychiatric/Behavioral: Negative for dysphoric mood.       Objective:   Physical Exam  Constitutional: She is oriented to person, place, and time. She appears well-developed and well-nourished. No distress.  HENT:  Mouth/Throat: Oropharynx is clear and moist. No oropharyngeal exudate.  Eyes: Right eye exhibits no discharge. Left eye exhibits no discharge. No scleral icterus.  Cardiovascular: Normal rate, regular rhythm and normal heart sounds.   No murmur  heard. Pulmonary/Chest: Effort normal and breath sounds normal. No respiratory distress.  Abdominal: Soft. Bowel sounds are normal. There is no tenderness.  Lymphadenopathy:    She has no cervical adenopathy.  Neurological: She is alert and oriented to person, place, and time.  Skin: Skin is warm and dry. No erythema.  Psychiatric: Her behavior is normal.          Assessment & Plan:

## 2010-11-20 NOTE — Telephone Encounter (Signed)
Called patient to get her scheduled for PAP with Angelique Blonder and find out when her last mammogram was. Wendall Mola CMA

## 2010-11-20 NOTE — Patient Instructions (Signed)
Exercise, low fat diet.

## 2010-12-05 DIAGNOSIS — R443 Hallucinations, unspecified: Secondary | ICD-10-CM | POA: Insufficient documentation

## 2010-12-05 DIAGNOSIS — R45851 Suicidal ideations: Secondary | ICD-10-CM | POA: Insufficient documentation

## 2010-12-05 DIAGNOSIS — Z8659 Personal history of other mental and behavioral disorders: Secondary | ICD-10-CM | POA: Insufficient documentation

## 2010-12-05 DIAGNOSIS — F1410 Cocaine abuse, uncomplicated: Secondary | ICD-10-CM | POA: Insufficient documentation

## 2010-12-05 DIAGNOSIS — R000 Tachycardia, unspecified: Secondary | ICD-10-CM | POA: Insufficient documentation

## 2010-12-05 DIAGNOSIS — F319 Bipolar disorder, unspecified: Secondary | ICD-10-CM | POA: Insufficient documentation

## 2010-12-05 DIAGNOSIS — F1010 Alcohol abuse, uncomplicated: Secondary | ICD-10-CM | POA: Insufficient documentation

## 2010-12-05 DIAGNOSIS — Z79899 Other long term (current) drug therapy: Secondary | ICD-10-CM | POA: Insufficient documentation

## 2010-12-06 ENCOUNTER — Emergency Department (HOSPITAL_COMMUNITY)
Admission: EM | Admit: 2010-12-06 | Discharge: 2010-12-08 | Disposition: A | Discharge status: Home / Self Care | Payer: Medicaid Other | Attending: Emergency Medicine | Admitting: Emergency Medicine

## 2010-12-06 ENCOUNTER — Encounter (HOSPITAL_COMMUNITY): Payer: Self-pay | Admitting: *Deleted

## 2010-12-06 ENCOUNTER — Other Ambulatory Visit: Payer: Self-pay

## 2010-12-06 ENCOUNTER — Emergency Department (HOSPITAL_COMMUNITY): Payer: Medicaid Other

## 2010-12-06 DIAGNOSIS — F329 Major depressive disorder, single episode, unspecified: Secondary | ICD-10-CM

## 2010-12-06 DIAGNOSIS — R440 Auditory hallucinations: Secondary | ICD-10-CM

## 2010-12-06 DIAGNOSIS — F1010 Alcohol abuse, uncomplicated: Secondary | ICD-10-CM

## 2010-12-06 DIAGNOSIS — F1410 Cocaine abuse, uncomplicated: Secondary | ICD-10-CM

## 2010-12-06 HISTORY — DX: Bipolar disorder, unspecified: F31.9

## 2010-12-06 LAB — URINALYSIS, ROUTINE W REFLEX MICROSCOPIC
Glucose, UA: NEGATIVE mg/dL
Leukocytes, UA: NEGATIVE
pH: 5.5 (ref 5.0–8.0)

## 2010-12-06 LAB — CBC
HCT: 32.6 % — ABNORMAL LOW (ref 36.0–46.0)
MCV: 76.7 fL — ABNORMAL LOW (ref 78.0–100.0)
RBC: 4.25 MIL/uL (ref 3.87–5.11)
RDW: 17.9 % — ABNORMAL HIGH (ref 11.5–15.5)
WBC: 7.3 10*3/uL (ref 4.0–10.5)

## 2010-12-06 LAB — COMPREHENSIVE METABOLIC PANEL
BUN: 17 mg/dL (ref 6–23)
CO2: 24 mEq/L (ref 19–32)
Chloride: 97 mEq/L (ref 96–112)
Creatinine, Ser: 0.82 mg/dL (ref 0.50–1.10)
GFR calc non Af Amer: 86 mL/min — ABNORMAL LOW (ref >90)
Total Bilirubin: 0.2 mg/dL — ABNORMAL LOW (ref 0.3–1.2)

## 2010-12-06 LAB — RAPID URINE DRUG SCREEN, HOSP PERFORMED: Opiates: NOT DETECTED

## 2010-12-06 LAB — URINE MICROSCOPIC-ADD ON

## 2010-12-06 LAB — ETHANOL: Alcohol, Ethyl (B): 16 mg/dL — ABNORMAL HIGH (ref 0–11)

## 2010-12-06 MED ORDER — ALUM & MAG HYDROXIDE-SIMETH 200-200-20 MG/5ML PO SUSP: 30.0000 mL | ORAL | Status: DC | PRN

## 2010-12-06 MED ORDER — ZOLPIDEM TARTRATE 5 MG PO TABS
5.0000 mg | ORAL_TABLET | Freq: Every evening | ORAL | Status: DC | PRN
  Administered 2010-12-07 – 2010-12-08 (×2): 5 mg via ORAL
  Filled 2010-12-06 (×2): qty 1

## 2010-12-06 MED ORDER — ONDANSETRON HCL 8 MG PO TABS
4.0000 mg | ORAL_TABLET | Freq: Three times a day (TID) | ORAL | Status: DC | PRN
  Administered 2010-12-08: 4 mg via ORAL
  Filled 2010-12-06: qty 1

## 2010-12-06 MED ORDER — THIAMINE HCL 100 MG/ML IJ SOLN
100.0000 mg | Freq: Every day | INTRAMUSCULAR | Status: DC
  Filled 2010-12-06: qty 2

## 2010-12-06 MED ORDER — SODIUM CHLORIDE 0.9 % IV BOLUS (SEPSIS)
1000.0000 mL | Freq: Once | INTRAVENOUS | Status: AC
  Administered 2010-12-06: 1000 mL via INTRAVENOUS

## 2010-12-06 MED ORDER — DIAZEPAM 5 MG PO TABS
10.0000 mg | ORAL_TABLET | Freq: Once | ORAL | Status: AC
  Administered 2010-12-06: 10 mg via ORAL
  Filled 2010-12-06: qty 2

## 2010-12-06 MED ORDER — LORAZEPAM 1 MG PO TABS
1.0000 mg | ORAL_TABLET | Freq: Four times a day (QID) | ORAL | Status: DC | PRN
  Administered 2010-12-08 (×2): 1 mg via ORAL
  Filled 2010-12-06 (×2): qty 1

## 2010-12-06 MED ORDER — LORAZEPAM 1 MG PO TABS
0.0000 mg | ORAL_TABLET | Freq: Four times a day (QID) | ORAL | Status: AC
  Administered 2010-12-07: 1 mg via ORAL
  Administered 2010-12-07 – 2010-12-08 (×2): 2 mg via ORAL
  Filled 2010-12-06 (×2): qty 2

## 2010-12-06 MED ORDER — LORAZEPAM 1 MG PO TABS
0.0000 mg | ORAL_TABLET | Freq: Two times a day (BID) | ORAL | Status: DC
  Administered 2010-12-06: 1 mg via ORAL
  Administered 2010-12-08: 2 mg via ORAL

## 2010-12-06 MED ORDER — THERA M PLUS PO TABS
1.0000 | ORAL_TABLET | Freq: Every day | ORAL | Status: DC
  Administered 2010-12-06 – 2010-12-08 (×3): 1 via ORAL
  Filled 2010-12-06 (×3): qty 1

## 2010-12-06 MED ORDER — LORAZEPAM 1 MG PO TABS
1.0000 mg | ORAL_TABLET | Freq: Three times a day (TID) | ORAL | Status: DC | PRN
  Administered 2010-12-07: 1 mg via ORAL
  Filled 2010-12-06: qty 1
  Filled 2010-12-06 (×2): qty 2
  Filled 2010-12-06: qty 1

## 2010-12-06 MED ORDER — ACETAMINOPHEN 325 MG PO TABS: 650.0000 mg | ORAL_TABLET | ORAL | Status: DC | PRN

## 2010-12-06 MED ORDER — FOLIC ACID 1 MG PO TABS
1.0000 mg | ORAL_TABLET | Freq: Every day | ORAL | Status: DC
  Administered 2010-12-06 – 2010-12-08 (×4): 1 mg via ORAL
  Filled 2010-12-06 (×3): qty 1

## 2010-12-06 MED ORDER — VITAMIN B-1 100 MG PO TABS
100.0000 mg | ORAL_TABLET | Freq: Every day | ORAL | Status: DC
  Administered 2010-12-07 – 2010-12-08 (×2): 100 mg via ORAL
  Filled 2010-12-06 (×2): qty 1

## 2010-12-06 MED ORDER — QUETIAPINE FUMARATE 200 MG PO TABS
200.0000 mg | ORAL_TABLET | ORAL | Status: AC
  Administered 2010-12-06: 200 mg via ORAL
  Filled 2010-12-06: qty 1

## 2010-12-06 MED ORDER — QUETIAPINE FUMARATE 200 MG PO TABS
200.0000 mg | ORAL_TABLET | Freq: Every day | ORAL | Status: DC
  Administered 2010-12-07 – 2010-12-08 (×2): 200 mg via ORAL
  Filled 2010-12-06 (×2): qty 1

## 2010-12-06 MED ORDER — IBUPROFEN 200 MG PO TABS
600.0000 mg | ORAL_TABLET | Freq: Three times a day (TID) | ORAL | Status: DC | PRN
  Administered 2010-12-06 – 2010-12-08 (×4): 600 mg via ORAL
  Filled 2010-12-06 (×4): qty 3

## 2010-12-06 MED ORDER — LORAZEPAM 1 MG PO TABS
1.0000 mg | ORAL_TABLET | Freq: Once | ORAL | Status: AC
  Administered 2010-12-06: 1 mg via ORAL
  Filled 2010-12-06: qty 1

## 2010-12-06 MED ORDER — LORAZEPAM 2 MG/ML IJ SOLN: 1.0000 mg | Freq: Four times a day (QID) | INTRAMUSCULAR | Status: DC | PRN

## 2010-12-06 NOTE — ED Notes (Signed)
IV team paged for IV.

## 2010-12-06 NOTE — ED Notes (Signed)
Assumed care of patient, report from [facility].  Introduced self to patient.  Resting on ed stretcher, vitals stable, patient requesting for motrin, medicated with 600 mg po, sitter remains at bedside.

## 2010-12-06 NOTE — ED Notes (Signed)
Patient's mother [phone]

## 2010-12-06 NOTE — ED Notes (Signed)
Patient resting, sitter at bedside, will continue to monitor.

## 2010-12-06 NOTE — ED Notes (Signed)
Pt 2nd meal tray was ordered

## 2010-12-06 NOTE — ED Provider Notes (Signed)
History     CSN: [id number] Arrival date & time: 12/06/2010 12:22 AM   First MD Initiated Contact with Patient 12/06/10 0111   The patient is a 45 year old female with a history of schizophrenia and bipolar disorder. She reports that she is having suicidal thoughts today. She was running out into traffic to try and kill herself. A friend stopped her and brought her here today. The patient reports that she is using cocaine and alcohol daily. She has history of one episode of withdrawal from alcohol in the past. Patient has prior suicide attempts with a resultant left upper extremity amputation below the elbow after she passed out on her arm during previous overdose attempt.  Patient is normally on Seroquel and says she has missed her medicine for the past 2 days. She reports hearing voices telling her to kill herself as well. She denies any pain and there no other associated or modifying factors. Chief Complaint  Patient presents with  . Suicidal    (Consider location/radiation/quality/duration/timing/severity/associated sxs/prior treatment) HPI  Past Medical History  Diagnosis Date  . Bipolar disorder   . Schizoaffective disorder     Past Surgical History  Procedure Date  . 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 Bowel obstruction   . Surgery r/t bowel obstruction     History reviewed. No pertinent family history.  History  Substance Use Topics  . Smoking status: Current Everyday Smoker -- 0.5 packs/day for 30 years    Types: Cigarettes  . Smokeless tobacco: Never Used  . Alcohol Use: 1.5 oz/week    3 drink(s) per week     beer heavy daily     OB History    Grav Para Term Preterm Abortions TAB SAB Ect Mult Living                  Review of Systems  Constitutional: Negative.   HENT: Negative.   Eyes: Negative.   Respiratory: Negative.   Cardiovascular: Negative.   Gastrointestinal: Negative.   Genitourinary: Negative.   Musculoskeletal: Negative.   Skin: Negative.     Neurological: Negative.   Hematological: Negative.   Psychiatric/Behavioral: Positive for suicidal ideas and hallucinations.  All other systems reviewed and are negative.    Allergies  Lithium and Thorazine  Home Medications   Current Outpatient Rx  Name Route Sig Dispense Refill  . ALPRAZOLAM 0.5 MG PO TABS Oral Take 0.5 mg by mouth 2 (two) times daily as needed. For anxiety     . QUETIAPINE FUMARATE 200 MG PO TABS Oral Take 200 mg by mouth 3 (three) times daily.      Marland Kitchen ZOLPIDEM TARTRATE 10 MG PO TABS Oral Take 10 mg by mouth at bedtime as needed. For sleep      BP 110/68  Pulse 93  Temp(Src) 97.6 F (36.4 C) (Oral)  Resp 20  SpO2 100%  LMP 11/29/2010  Physical Exam  Nursing note and vitals reviewed. Constitutional: She is oriented to person, place, and time. She appears well-developed and well-nourished. No distress.  HENT:  Head: Normocephalic and atraumatic.  Eyes: Conjunctivae and EOM are normal. Pupils are equal, round, and reactive to light.       Injected sclera bilaterally  Neck: Normal range of motion.  Cardiovascular: Regular rhythm.  Tachycardia present.   Pulmonary/Chest: Effort normal and breath sounds normal. No respiratory distress. She has no wheezes. She has no rales.  Abdominal: Soft. Bowel sounds are normal. She exhibits no distension. There is no tenderness.  There is no rebound and no guarding.  Musculoskeletal: Normal range of motion. She exhibits no edema and no tenderness.       Patient with prior left upper extremity amputation below the elbow  Neurological: She is alert and oriented to person, place, and time. No cranial nerve deficit. She exhibits normal muscle tone. Coordination normal.  Skin: Skin is warm and dry.  Psychiatric: Her speech is normal. Her affect is blunt. She is actively hallucinating. She expresses suicidal ideation. She expresses suicidal plans.    ED Course  Procedures (including critical care time)  Date: 12/06/2010   Rate: 97  Rhythm: normal sinus rhythm  QRS Axis: normal  Intervals: normal  ST/T Wave abnormalities: nonspecific T wave changes  Conduction Disutrbances:none  Narrative Interpretation: No acute ischemia  Old EKG Reviewed: unchanged   Labs Reviewed  CBC - Abnormal; Notable for the following:    Hemoglobin 10.6 (*)    HCT 32.6 (*)    MCV 76.7 (*)    MCH 24.9 (*)    RDW 17.9 (*)    All other components within normal limits  COMPREHENSIVE METABOLIC PANEL - Abnormal; Notable for the following:    Sodium 134 (*)    Glucose, Bld 119 (*)    Total Bilirubin 0.2 (*)    GFR calc non Af Amer 86 (*)    All other components within normal limits  ETHANOL - Abnormal; Notable for the following:    Alcohol, Ethyl (B) 16 (*)    All other components within normal limits  URINE RAPID DRUG SCREEN (HOSP PERFORMED) - Abnormal; Notable for the following:    Cocaine POSITIVE (*)    All other components within normal limits  URINALYSIS, ROUTINE W REFLEX MICROSCOPIC - Abnormal; Notable for the following:    Appearance CLOUDY (*)    Hgb urine dipstick SMALL (*)    All other components within normal limits  URINE MICROSCOPIC-ADD ON - Abnormal; Notable for the following:    Squamous Epithelial / LPF MANY (*)    Bacteria, UA FEW (*)    All other components within normal limits  ACETAMINOPHEN LEVEL   Dg Chest 2 View  12/06/2010  *RADIOLOGY REPORT*  Clinical Data: Cough.  CHEST - 2 VIEW  Comparison: Chest radiograph performed 08/30/2010  Findings: The lungs are well-aerated.  Mild bibasilar linear opacities may reflect atelectasis or possibly pneumonia.  There is no evidence of pleural effusion or pneumothorax.  The heart is normal in size; the mediastinal contour is within normal limits.  No acute osseous abnormalities are seen.  IMPRESSION: Bibasilar linear opacities may reflect atelectasis or possibly pneumonia.  Original Report Authenticated By: Tonia Ghent, M.D.     1. Suicidal behavior   2.  Cocaine abuse   3. Alcohol abuse   4. Auditory hallucinations       MDM   Patient was evaluated by myself. She was tachycardic and her blood pressure was in the 100s over 60s. She denied any shortness of breath, chest pain, cough, or fevers. Workup for her presentation included a CBC with no signs of leukocytosis with a slight anemia. Anemia is not surprising given patient's chronic substance abuse. I did confirm that she's not had any blood in her stools or dark tarry stools. She also had no chest pain or shortness of breath. Urinalysis was unremarkable. Patient's renal panel was remarkable only for a sodium of 134. Her urine drug screen did demonstrate the presence of cocaine. Patient had a chest x-ray  given her tachycardia. This showed bibasilar linear opacities concerning for atelectasis versus pneumonia. Given that the patient denied any coughing, was not short of breath, and had no leukocytosis these are presumed to be atelectasis and no antibiotics were ordered. Patient's EKG was unchanged from previous. I spoke to the act team. They were unable to evaluate the patient until the end of my shift. At this time patient is pending final disposition. Holding orders have been placed and affect including C. were protocol. Patient did receive 1 L normal saline IV bolus as well as Valium 10 mg by mouth and Ativan 1 mg by mouth while in the care prior to being transferred over to a different area for monitoring during completion of her psych workup.      Cyndra Numbers, MD 12/06/10 [phone]
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
## 2010-12-06 NOTE — ED Notes (Signed)
Here by ambulance, here for hearing voices telling her to hurtself, admits to SI, states, "I need help", denies physical sx, admits to ETOH ("started drinking heavily"), last drink "this afternoon", last drug use yesterday ("crack cocaine"), admits to hallucinations ("just hearing voices telling her to hurtself"). Stay in [place], has local family in [place]. Has been here for the same previously ~ 1 yr ago.

## 2010-12-06 NOTE — ED Provider Notes (Addendum)
Pt alert, content, no c/o. Discussed w act team, plan is for pt to go to [facility], 400 bed when available.  Signed out to dr Bebe Shaggy to follow up with act team and transfer to [facility] when bed becomes available.   Suzi Roots, MD 12/06/10 1533  Suzi Roots, MD 12/06/10 [phone]

## 2010-12-07 MED ORDER — DIAZEPAM 5 MG PO TABS
5.0000 mg | ORAL_TABLET | Freq: Once | ORAL | Status: AC
  Administered 2010-12-07: 5 mg via ORAL
  Filled 2010-12-07: qty 1

## 2010-12-07 MED ORDER — NICOTINE 21 MG/24HR TD PT24
21.0000 mg | MEDICATED_PATCH | Freq: Every day | TRANSDERMAL | Status: DC
  Administered 2010-12-07 – 2010-12-08 (×2): 21 mg via TRANSDERMAL
  Filled 2010-12-07 (×2): qty 1

## 2010-12-07 MED ORDER — FOLIC ACID 1 MG PO TABS
1.0000 mg | ORAL_TABLET | Freq: Once | ORAL | Status: AC
  Administered 2010-12-07: 1 mg via ORAL
  Filled 2010-12-07: qty 1

## 2010-12-07 NOTE — ED Notes (Signed)
Agitated; demanding to be let go; told she cannot go b/c suicidal; Dr. Freida Busman aware and orders received.

## 2010-12-07 NOTE — ED Notes (Signed)
I gave the patient a pack of chocolate chip cookies, a pack of nabs, and a pack of pretzels.

## 2010-12-07 NOTE — ED Notes (Signed)
Breakfast tray ordered 

## 2010-12-07 NOTE — ED Notes (Signed)
I gave the patient a cup of ice. 

## 2010-12-07 NOTE — ED Notes (Signed)
ACT returned call; [facility] on a [facility] bed at [facility].

## 2010-12-07 NOTE — ED Notes (Signed)
Patient up to restroom, steady gait, sitter with patient

## 2010-12-07 NOTE — ED Notes (Signed)
Patient was up to use phone and now very agitated, medicated with Ativan 1 mg PO

## 2010-12-07 NOTE — ED Notes (Signed)
Coke and ginger ale provided.

## 2010-12-07 NOTE — ED Notes (Signed)
Breakfast tray provided. 

## 2010-12-08 NOTE — ED Notes (Signed)
Dinner tray with no sharps delivered to patient by nutrition services
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
## 2010-12-08 NOTE — ED Notes (Signed)
PT ambulatory to bathroom. Steady gait without assistance.

## 2010-12-08 NOTE — ED Provider Notes (Signed)
The patient has been in the emergency department for over 5 days for evaluation of suicidal ideations.  She states that she is depressed and she had said 5 days ago, that she wanted to jump in front of traffic.  She no longer feels suicidal.  She has no intent of harming herself.  Her boyfriend is at the bedside, and they live together.  He is comfortable taking her home.  She had undergone [facility] psychiatry evaluation.  This evening.  The psychiatrist, Dr. Lydia Guiles does not think that she is suicidal and he thinks that there is no indication for involuntary commitment at this time.  I reviewed the chart and reevaluated the patient.  I concur.  She has no thoughts of suicidal ideation.  At this time and there is no indication for continued cold in the emergency department.  We will release her to home with her boyfriend.  Nicholes Stairs, MD 12/08/10 [phone]

## 2010-12-08 NOTE — ED Notes (Signed)
Pt DOES NOT have an IV in place. Unsure of actual removal date/time. No indication for IV/SL restart at this time.

## 2010-12-08 NOTE — ED Notes (Signed)
Pt threatening to leave; agitated,using foul language loudly, wearing grey sweat pants, wandering into hallway with sitter. Consulting civil engineer notified. Martie Lee, charge RN in to see pt. Security called and arrived. Pt changed back into paper scrubs and grey sweat pants and additional clothing and phones ( apparently brought in by visitor) logged and into lockers # 1, 2 and 3.  Security reminding pt to stay in room.Two Security officers keeping visible presence outside of pt's room.
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
## 2010-12-08 NOTE — ED Notes (Signed)
Officer Lingle and second Event organiser served papers to commit patient. Patient verbalized understanding to officers  of not being able to leave.

## 2010-12-08 NOTE — ED Notes (Signed)
Pt in room 28 with sitter and patient assessment staff for teleconference with psychiatrist.

## 2010-12-08 NOTE — ED Notes (Signed)
Pt states that she wants to go home. Explained to pt that we were currently waiting on telepsych results and further MD recommendations/orders. ACT team at bedside to reiterate. Pt verbalized understanding. Dinner tray with no sharps ordered for patient. Sitter at bedside. Will continue to monitor.

## 2010-12-08 NOTE — Progress Notes (Signed)
Patient had a telepsychiatry consult performed earlier in the afternoon.  Dr. Debbora Presto with [facility] recommended releasing patient from petition.  Dr. Nino Parsley saw patient and agreed with this release and signed the Examination & Recommendation form.  Patient will return home tonight.  Clinician contacted [facility] to let them know patient needs to be off their telephone referral list. Beatriz Stallion, M.S. QP 12-08-10 2 22:35

## 2010-12-08 NOTE — ED Notes (Signed)
Pt staying in room, calmer,  and being cooperative at present.

## 2010-12-15 ENCOUNTER — Encounter (HOSPITAL_COMMUNITY): Payer: Self-pay | Admitting: Emergency Medicine

## 2010-12-15 ENCOUNTER — Emergency Department (HOSPITAL_COMMUNITY)
Admission: EM | Admit: 2010-12-15 | Discharge: 2010-12-18 | Disposition: A | Discharge status: Home / Self Care | Payer: Medicaid Other | Attending: Emergency Medicine | Admitting: Emergency Medicine

## 2010-12-15 DIAGNOSIS — F3289 Other specified depressive episodes: Secondary | ICD-10-CM | POA: Insufficient documentation

## 2010-12-15 DIAGNOSIS — S0120XA Unspecified open wound of nose, initial encounter: Secondary | ICD-10-CM | POA: Insufficient documentation

## 2010-12-15 DIAGNOSIS — F17200 Nicotine dependence, unspecified, uncomplicated: Secondary | ICD-10-CM | POA: Insufficient documentation

## 2010-12-15 DIAGNOSIS — F329 Major depressive disorder, single episode, unspecified: Secondary | ICD-10-CM

## 2010-12-15 DIAGNOSIS — R45851 Suicidal ideations: Secondary | ICD-10-CM

## 2010-12-15 HISTORY — DX: Intentional self-harm by other specified means, initial encounter: X83.8XXA

## 2010-12-15 LAB — CBC
HCT: 35.9 % — ABNORMAL LOW (ref 36.0–46.0)
Hemoglobin: 12 g/dL (ref 12.0–15.0)
MCH: 25.9 pg — ABNORMAL LOW (ref 26.0–34.0)
MCHC: 33.4 g/dL (ref 30.0–36.0)
MCV: 77.4 fL — ABNORMAL LOW (ref 78.0–100.0)
Platelets: 360 K/uL (ref 150–400)
RBC: 4.64 MIL/uL (ref 3.87–5.11)
RDW: 17.5 % — ABNORMAL HIGH (ref 11.5–15.5)
WBC: 7.3 10*3/uL (ref 4.0–10.5)

## 2010-12-15 LAB — RAPID URINE DRUG SCREEN, HOSP PERFORMED
Amphetamines: NOT DETECTED
Barbiturates: NOT DETECTED
Benzodiazepines: NOT DETECTED
Cocaine: NOT DETECTED
Opiates: NOT DETECTED
Tetrahydrocannabinol: NOT DETECTED

## 2010-12-15 LAB — COMPREHENSIVE METABOLIC PANEL WITH GFR
ALT: 24 U/L (ref 0–35)
AST: 34 U/L (ref 0–37)
Albumin: 3.6 g/dL (ref 3.5–5.2)
CO2: 21 meq/L (ref 19–32)
Calcium: 9.1 mg/dL (ref 8.4–10.5)
Creatinine, Ser: 0.83 mg/dL (ref 0.50–1.10)
Sodium: 133 meq/L — ABNORMAL LOW (ref 135–145)
Total Protein: 7.8 g/dL (ref 6.0–8.3)

## 2010-12-15 LAB — POCT PREGNANCY, URINE: Preg Test, Ur: NEGATIVE

## 2010-12-15 LAB — COMPREHENSIVE METABOLIC PANEL
Alkaline Phosphatase: 101 U/L (ref 39–117)
BUN: 10 mg/dL (ref 6–23)
Chloride: 98 mEq/L (ref 96–112)
GFR calc Af Amer: >90 mL/min (ref >90)
GFR calc non Af Amer: 85 mL/min — ABNORMAL LOW (ref >90)
Glucose, Bld: 99 mg/dL (ref 70–99)
Potassium: 3.6 mEq/L (ref 3.5–5.1)
Total Bilirubin: 0.2 mg/dL — ABNORMAL LOW (ref 0.3–1.2)

## 2010-12-15 LAB — ETHANOL: Alcohol, Ethyl (B): 127 mg/dL — ABNORMAL HIGH (ref 0–11)

## 2010-12-15 MED ORDER — THERA M PLUS PO TABS
1.0000 | ORAL_TABLET | Freq: Every day | ORAL | Status: DC
  Administered 2010-12-15 – 2010-12-16 (×2): 1 via ORAL
  Administered 2010-12-17: 10:00:00 via ORAL
  Administered 2010-12-18: 1 via ORAL
  Filled 2010-12-15 (×4): qty 1

## 2010-12-15 MED ORDER — ZOLPIDEM TARTRATE 10 MG PO TABS: 10.0000 mg | ORAL_TABLET | Freq: Every evening | ORAL | Status: DC | PRN

## 2010-12-15 MED ORDER — DIVALPROEX SODIUM 125 MG PO DR TAB
125.0000 mg | DELAYED_RELEASE_TABLET | Freq: Three times a day (TID) | ORAL | Status: DC
  Administered 2010-12-15 – 2010-12-18 (×8): 125 mg via ORAL
  Filled 2010-12-15 (×12): qty 1

## 2010-12-15 MED ORDER — LORAZEPAM 2 MG/ML IJ SOLN: 1.0000 mg | Freq: Four times a day (QID) | INTRAMUSCULAR | Status: AC | PRN

## 2010-12-15 MED ORDER — QUETIAPINE FUMARATE 100 MG PO TABS
200.0000 mg | ORAL_TABLET | Freq: Three times a day (TID) | ORAL | Status: DC
  Administered 2010-12-15 – 2010-12-18 (×11): 200 mg via ORAL
  Filled 2010-12-15 (×7): qty 2
  Filled 2010-12-15: qty 1

## 2010-12-15 MED ORDER — ALPRAZOLAM 0.5 MG PO TABS
0.5000 mg | ORAL_TABLET | Freq: Two times a day (BID) | ORAL | Status: DC | PRN
  Administered 2010-12-15 – 2010-12-17 (×3): 0.5 mg via ORAL
  Filled 2010-12-15 (×3): qty 1

## 2010-12-15 MED ORDER — FOLIC ACID 1 MG PO TABS
1.0000 mg | ORAL_TABLET | Freq: Every day | ORAL | Status: DC
  Administered 2010-12-15 – 2010-12-18 (×4): 1 mg via ORAL
  Filled 2010-12-15 (×3): qty 1

## 2010-12-15 MED ORDER — LORAZEPAM 1 MG PO TABS
0.0000 mg | ORAL_TABLET | Freq: Four times a day (QID) | ORAL | Status: AC
  Administered 2010-12-15: 1 mg via ORAL
  Administered 2010-12-15 (×2): 2 mg via ORAL
  Filled 2010-12-15: qty 1
  Filled 2010-12-15 (×2): qty 2

## 2010-12-15 MED ORDER — THIAMINE HCL 100 MG/ML IJ SOLN: 100.0000 mg | Freq: Every day | INTRAMUSCULAR | Status: DC

## 2010-12-15 MED ORDER — LORAZEPAM 1 MG PO TABS
0.0000 mg | ORAL_TABLET | Freq: Two times a day (BID) | ORAL | Status: DC
  Administered 2010-12-17 – 2010-12-18 (×2): 1 mg via ORAL

## 2010-12-15 MED ORDER — LORAZEPAM 1 MG PO TABS
1.0000 mg | ORAL_TABLET | Freq: Four times a day (QID) | ORAL | Status: AC | PRN
  Administered 2010-12-17 (×2): 1 mg via ORAL
  Filled 2010-12-15: qty 1

## 2010-12-15 MED ORDER — TETANUS-DIPHTH-ACELL PERTUSSIS 5-2.5-18.5 LF-MCG/0.5 IM SUSP
0.5000 mL | Freq: Once | INTRAMUSCULAR | Status: AC
  Administered 2010-12-15: 0.5 mL via INTRAMUSCULAR
  Filled 2010-12-15: qty 0.5

## 2010-12-15 MED ORDER — ONDANSETRON HCL 4 MG PO TABS: 4.0000 mg | ORAL_TABLET | Freq: Three times a day (TID) | ORAL | Status: DC | PRN

## 2010-12-15 MED ORDER — VITAMIN B-1 100 MG PO TABS
100.0000 mg | ORAL_TABLET | Freq: Every day | ORAL | Status: DC
  Administered 2010-12-15 – 2010-12-18 (×4): 100 mg via ORAL
  Filled 2010-12-15 (×3): qty 1

## 2010-12-15 MED ORDER — ACETAMINOPHEN 325 MG PO TABS: 650.0000 mg | ORAL_TABLET | ORAL | Status: DC | PRN

## 2010-12-15 MED ORDER — NICOTINE 21 MG/24HR TD PT24
21.0000 mg | MEDICATED_PATCH | Freq: Every day | TRANSDERMAL | Status: DC
  Administered 2010-12-15 – 2010-12-18 (×3): 21 mg via TRANSDERMAL
  Filled 2010-12-15 (×2): qty 1

## 2010-12-15 NOTE — ED Provider Notes (Signed)
  Physical Exam  BP 93/76  Pulse 104  Temp(Src) 98.5 F (36.9 C) (Oral)  Resp 20  SpO2 100%  LMP 11/29/2010  Physical Exam  ED Course  Procedures  MDM Pt presented to ED w/ SI.  I have filled out psych and alcohol withdrawal holding orders.  Dr. Freida Busman aware of patient.        Otilio Miu, [place] 12/15/10 [phone]
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
## 2010-12-15 NOTE — ED Notes (Addendum)
Pt resting with eyes closed with sitter at bedside
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
## 2010-12-15 NOTE — ED Provider Notes (Signed)
Evaluation and management procedures were performed by the mid-level provider (PA/NP/CNM) under my supervision/collaboration. I was present and available during the ED course. Aadvika Konen Y.   Inetha Maret Y. Sharday Michl, MD 12/15/10 0647 

## 2010-12-16 NOTE — ED Notes (Signed)
Pt has 2 belonging bags under the cabinet next to the printer in TCU

## 2010-12-16 NOTE — ED Notes (Signed)
IVC papers have been completed and faxed to magistrate. Pt information has been sent to [facility] for review. Oncoming staff will need to complete authorization for [facility] and confirm pt is on the wait list.
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
## 2010-12-16 NOTE — ED Notes (Signed)
Escorted the pt. To [facility] ED

## 2010-12-16 NOTE — ED Provider Notes (Signed)
Pt cooperative, alert, eating breakfast. Continues to feel suicidal  Doug Sou, MD 12/16/10 [phone]

## 2010-12-16 NOTE — ED Notes (Signed)
Report received; pt care assumed.

## 2010-12-17 MED ORDER — QUETIAPINE FUMARATE 100 MG PO TABS
ORAL_TABLET | ORAL | Status: AC
  Administered 2010-12-17: 200 mg via ORAL
  Filled 2010-12-17: qty 1

## 2010-12-17 NOTE — ED Notes (Signed)
TC from [place] @ [facility]. Stated that they had reviewed the information sent over, but due to acuity issues on their unit, they would not be able to accept patient at this time.

## 2010-12-17 NOTE — ED Provider Notes (Signed)
See note associated with NP Ayesha Mohair. Matt Delpizzo, MD 12/17/10 1041

## 2010-12-17 NOTE — ED Notes (Signed)
IVC papers served and placed in chart

## 2010-12-17 NOTE — ED Provider Notes (Signed)
Pt on [facility] wait list; denies complaints at present.  Hurman Horn, MD 12/17/10 [phone]

## 2010-12-17 NOTE — ED Notes (Signed)
TC with Justin @ [facility]. Stated they do not have sponsorship beds and do not accept adult MCD.  TC with the call center at [facility]. Only have detox beds.  TC with Laura @ [facility]. Informed they do currently have beds. Faxed info over for review. 

## 2010-12-17 NOTE — ED Notes (Signed)
TC with Cheryl @ [facility]. Called to confirm if pt is on wait list. Stated they needed an updated EKG. Will see if this is completed and fax to them.

## 2010-12-18 MED ORDER — QUETIAPINE FUMARATE 200 MG PO TABS: 200.0000 mg | ORAL_TABLET | Freq: Three times a day (TID) | ORAL | Status: DC

## 2010-12-18 MED ORDER — ZOLPIDEM TARTRATE 10 MG PO TABS: 10.0000 mg | ORAL_TABLET | Freq: Every evening | ORAL | Status: DC | PRN

## 2010-12-18 NOTE — Discharge Instructions (Signed)
Followup with your therapist next week °

## 2010-12-18 NOTE — BH Assessment (Signed)
Pt sts, "I need to talk to someone ..I want to go home". This Clinical research associate informed pt that she would be re-assessed today and disposition would be re-evaluated based on current symptoms presents.  Melynda Ripple, MS (Assessment Counselor)
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
## 2010-12-18 NOTE — ED Provider Notes (Addendum)
Patient seen and examined today. Resting comfortably, has no new complaints. Patient awaiting this was sent to [facility] and this was confirmed yesterday  Toy Baker, MD 12/18/10 0827  2:43 PM Pt had tele-psych today and now has been cleared for discharge--pt denies suicidal ideations. She will be given refills of her meds and has f/u with her psychiatrist next week  Toy Baker, MD 12/18/10 1444

## 2011-03-16 ENCOUNTER — Telehealth: Payer: Self-pay | Admitting: *Deleted

## 2011-03-16 NOTE — Telephone Encounter (Signed)
Last HIV labs were in 08/2010. Wanted an appt today to get them done. Transferred to front to make an appt

## 2011-03-18 ENCOUNTER — Telehealth: Payer: Self-pay | Admitting: *Deleted

## 2011-03-18 NOTE — Telephone Encounter (Signed)
Message left to call for PAP smear appt.

## 2011-03-19 ENCOUNTER — Other Ambulatory Visit: Payer: Medicaid Other

## 2011-04-02 ENCOUNTER — Ambulatory Visit: Payer: Medicaid Other | Admitting: Internal Medicine

## 2011-04-06 ENCOUNTER — Other Ambulatory Visit: Payer: Medicaid Other

## 2011-04-09 ENCOUNTER — Ambulatory Visit: Payer: Medicaid Other | Admitting: Internal Medicine

## 2011-04-13 ENCOUNTER — Other Ambulatory Visit: Payer: Medicaid Other

## 2011-04-28 ENCOUNTER — Ambulatory Visit: Payer: Medicaid Other | Admitting: Internal Medicine

## 2011-06-05 ENCOUNTER — Other Ambulatory Visit: Payer: Medicaid Other

## 2011-06-30 ENCOUNTER — Other Ambulatory Visit: Payer: Medicaid Other

## 2011-06-30 ENCOUNTER — Ambulatory Visit: Payer: Medicaid Other | Admitting: Internal Medicine

## 2011-06-30 DIAGNOSIS — Z79899 Other long term (current) drug therapy: Secondary | ICD-10-CM

## 2011-06-30 DIAGNOSIS — B20 Human immunodeficiency virus [HIV] disease: Secondary | ICD-10-CM

## 2011-06-30 LAB — COMPLETE METABOLIC PANEL WITH GFR
ALT: 18 U/L (ref 0–35)
BUN: 12 mg/dL (ref 6–23)
CO2: 21 mEq/L (ref 19–32)
Calcium: 9.2 mg/dL (ref 8.4–10.5)
Chloride: 105 mEq/L (ref 96–112)
Creat: 1.02 mg/dL (ref 0.50–1.10)
GFR, Est African American: 78 mL/min
GFR, Est Non African American: 68 mL/min
Glucose, Bld: 86 mg/dL (ref 70–99)
Total Bilirubin: 0.3 mg/dL (ref 0.3–1.2)

## 2011-06-30 LAB — LIPID PANEL
Cholesterol: 187 mg/dL (ref 0–200)
Triglycerides: 102 mg/dL (ref <150)

## 2011-06-30 LAB — CBC WITH DIFFERENTIAL/PLATELET
Basophils Relative: 0 % (ref 0–1)
Eosinophils Absolute: 0.1 10*3/uL (ref 0.0–0.7)
Lymphs Abs: 3.4 10*3/uL (ref 0.7–4.0)
MCH: 26.1 pg (ref 26.0–34.0)
Neutro Abs: 3 10*3/uL (ref 1.7–7.7)
Neutrophils Relative %: 44 % (ref 43–77)
Platelets: 279 10*3/uL (ref 150–400)
RBC: 4.82 MIL/uL (ref 3.87–5.11)

## 2011-07-01 LAB — T-HELPER CELL (CD4) - (RCID CLINIC ONLY): CD4 % Helper T Cell: 45 % (ref 33–55)

## 2011-07-02 LAB — HIV-1 RNA QUANT-NO REFLEX-BLD: HIV-1 RNA Quant, Log: 2.54 {Log} — ABNORMAL HIGH (ref <1.30)

## 2011-07-26 ENCOUNTER — Emergency Department (HOSPITAL_COMMUNITY)
Admission: EM | Admit: 2011-07-26 | Discharge: 2011-07-26 | Disposition: A | Discharge status: Home / Self Care | Payer: Medicaid Other | Attending: Emergency Medicine | Admitting: Emergency Medicine

## 2011-07-26 ENCOUNTER — Encounter (HOSPITAL_COMMUNITY): Payer: Self-pay | Admitting: Emergency Medicine

## 2011-07-26 DIAGNOSIS — R55 Syncope and collapse: Secondary | ICD-10-CM | POA: Insufficient documentation

## 2011-07-26 DIAGNOSIS — F259 Schizoaffective disorder, unspecified: Secondary | ICD-10-CM | POA: Insufficient documentation

## 2011-07-26 DIAGNOSIS — F1910 Other psychoactive substance abuse, uncomplicated: Secondary | ICD-10-CM | POA: Insufficient documentation

## 2011-07-26 DIAGNOSIS — F17200 Nicotine dependence, unspecified, uncomplicated: Secondary | ICD-10-CM | POA: Insufficient documentation

## 2011-07-26 DIAGNOSIS — S48119A Complete traumatic amputation at level between unspecified shoulder and elbow, initial encounter: Secondary | ICD-10-CM | POA: Insufficient documentation

## 2011-07-26 DIAGNOSIS — R110 Nausea: Secondary | ICD-10-CM | POA: Insufficient documentation

## 2011-07-26 DIAGNOSIS — F319 Bipolar disorder, unspecified: Secondary | ICD-10-CM | POA: Insufficient documentation

## 2011-07-26 DIAGNOSIS — Z888 Allergy status to other drugs, medicaments and biological substances status: Secondary | ICD-10-CM | POA: Insufficient documentation

## 2011-07-26 LAB — RAPID URINE DRUG SCREEN, HOSP PERFORMED
Amphetamines: NOT DETECTED
Benzodiazepines: NOT DETECTED
Opiates: NOT DETECTED

## 2011-07-26 MED ORDER — ONDANSETRON 4 MG PO TBDP
4.0000 mg | ORAL_TABLET | Freq: Once | ORAL | Status: AC
  Administered 2011-07-26: 4 mg via ORAL
  Filled 2011-07-26: qty 1

## 2011-07-26 MED ORDER — ONDANSETRON HCL 4 MG PO TABS: 4.0000 mg | ORAL_TABLET | Freq: Four times a day (QID) | ORAL | Status: AC
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
## 2011-07-26 NOTE — Discharge Instructions (Signed)
Use zofran as needed for nausea and follow a very bland diet today. Follow up with your primary care provider as needed. Avoid ANY drug use because of dangers of not knowing what you are ingesting and the ultimate side effects of unknown substances.   B.R.A.T. Diet Your doctor has recommended the B.R.A.T. diet for you or your child until the condition improves. This is often used to help control diarrhea and vomiting symptoms. If you or your child can tolerate clear liquids, you may have:  Bananas.   Rice.   Applesauce.   Toast (and other simple starches such as crackers, potatoes, noodles).  Be sure to avoid dairy products, meats, and fatty foods until symptoms are better. Fruit juices such as apple, grape, and prune juice can make diarrhea worse. Avoid these. Continue this diet for 2 days or as instructed by your caregiver. Document Released: 01/12/2005 Document Revised: 01/01/2011 Document Reviewed: 07/01/2006 [facility] Patient Information 2012 [place], [place].

## 2011-07-26 NOTE — ED Provider Notes (Signed)
Medical screening examination/treatment/procedure(s) were performed by non-physician practitioner and as supervising physician I was immediately available for consultation/collaboration.   Glynn Octave, MD 07/26/11 [phone]

## 2011-07-26 NOTE — ED Notes (Signed)
Patient states she was walking around in her house last night and felt dizzy so she laid down on the couch and "passed out" woke up this morning and is now feeling nauseas

## 2011-07-27 ENCOUNTER — Telehealth: Payer: Self-pay | Admitting: *Deleted

## 2011-07-27 NOTE — Telephone Encounter (Signed)
Referral made to [facility] for multiple no shows. Wendall Mola CMA

## 2011-07-27 NOTE — Telephone Encounter (Signed)
Message copied by Macy Mis on Mon Jul 27, 2011  9:08 AM ------      Message from: Gardiner Barefoot      Created: Mon Jul 27, 2011  8:54 AM       She was just in the ED.  She has only been seen once and goes to the ED more often including this weekend.  Could we see about [facility] for her?  She needs Psychiatry and a PCP as well as an appt with me.              Thanks

## 2011-07-28 ENCOUNTER — Ambulatory Visit: Payer: Medicaid Other | Admitting: Internal Medicine

## 2011-08-04 ENCOUNTER — Ambulatory Visit: Payer: Medicaid Other | Admitting: Internal Medicine

## 2011-08-09 ENCOUNTER — Encounter (HOSPITAL_COMMUNITY): Payer: Self-pay

## 2011-08-09 ENCOUNTER — Emergency Department (HOSPITAL_COMMUNITY)
Admission: EM | Admit: 2011-08-09 | Discharge: 2011-08-09 | Disposition: A | Discharge status: Home / Self Care | Payer: Medicaid Other | Attending: Emergency Medicine | Admitting: Emergency Medicine

## 2011-08-09 DIAGNOSIS — F1411 Cocaine abuse, in remission: Secondary | ICD-10-CM | POA: Insufficient documentation

## 2011-08-09 DIAGNOSIS — F259 Schizoaffective disorder, unspecified: Secondary | ICD-10-CM | POA: Insufficient documentation

## 2011-08-09 DIAGNOSIS — F319 Bipolar disorder, unspecified: Secondary | ICD-10-CM | POA: Insufficient documentation

## 2011-08-09 DIAGNOSIS — F17200 Nicotine dependence, unspecified, uncomplicated: Secondary | ICD-10-CM | POA: Insufficient documentation

## 2011-08-09 DIAGNOSIS — F1490 Cocaine use, unspecified, uncomplicated: Secondary | ICD-10-CM

## 2011-08-09 LAB — URINALYSIS, ROUTINE W REFLEX MICROSCOPIC
Glucose, UA: NEGATIVE mg/dL
Protein, ur: 100 mg/dL — AB
Specific Gravity, Urine: 1.02 (ref 1.005–1.030)
pH: 7.5 (ref 5.0–8.0)

## 2011-08-09 LAB — POCT I-STAT, CHEM 8
Calcium, Ion: 1.15 mmol/L (ref 1.12–1.23)
Chloride: 107 mEq/L (ref 96–112)
HCT: 38 % (ref 36.0–46.0)
Potassium: 4 mEq/L (ref 3.5–5.1)

## 2011-08-09 LAB — CBC
Hemoglobin: 12.1 g/dL (ref 12.0–15.0)
RBC: 4.47 MIL/uL (ref 3.87–5.11)
WBC: 5.8 10*3/uL (ref 4.0–10.5)

## 2011-08-09 LAB — RAPID URINE DRUG SCREEN, HOSP PERFORMED
Barbiturates: NOT DETECTED
Tetrahydrocannabinol: NOT DETECTED

## 2011-08-09 LAB — URINE MICROSCOPIC-ADD ON

## 2011-08-09 LAB — PREGNANCY, URINE: Preg Test, Ur: NEGATIVE

## 2011-08-09 MED ORDER — ONDANSETRON 4 MG PO TBDP: 4.0000 mg | ORAL_TABLET | Freq: Once | ORAL | Status: DC

## 2011-08-09 MED ORDER — PROMETHAZINE HCL 25 MG PO TABS
25.0000 mg | ORAL_TABLET | Freq: Once | ORAL | Status: AC
  Administered 2011-08-09: 25 mg via ORAL
  Filled 2011-08-09: qty 1

## 2011-08-09 NOTE — ED Provider Notes (Signed)
Medical screening examination/treatment/procedure(s) were conducted as a shared visit with non-physician practitioner(s) and myself.  I personally evaluated the patient during the encounter  Doug Sou, MD 08/09/11 1836
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
## 2011-08-14 ENCOUNTER — Encounter (HOSPITAL_COMMUNITY): Payer: Self-pay | Admitting: *Deleted

## 2011-08-14 ENCOUNTER — Emergency Department (HOSPITAL_COMMUNITY)
Admission: EM | Admit: 2011-08-14 | Discharge: 2011-08-14 | Disposition: A | Discharge status: Home / Self Care | Payer: Medicaid Other | Attending: Emergency Medicine | Admitting: Emergency Medicine

## 2011-08-14 DIAGNOSIS — F319 Bipolar disorder, unspecified: Secondary | ICD-10-CM | POA: Insufficient documentation

## 2011-08-14 DIAGNOSIS — F17200 Nicotine dependence, unspecified, uncomplicated: Secondary | ICD-10-CM | POA: Insufficient documentation

## 2011-08-14 DIAGNOSIS — R1110 Vomiting, unspecified: Secondary | ICD-10-CM | POA: Insufficient documentation

## 2011-08-14 DIAGNOSIS — F259 Schizoaffective disorder, unspecified: Secondary | ICD-10-CM | POA: Insufficient documentation

## 2011-08-14 LAB — POCT I-STAT, CHEM 8
BUN: 9 mg/dL (ref 6–23)
Chloride: 106 mEq/L (ref 96–112)
Potassium: 3.8 mEq/L (ref 3.5–5.1)
Sodium: 139 mEq/L (ref 135–145)

## 2011-08-14 LAB — RAPID URINE DRUG SCREEN, HOSP PERFORMED
Barbiturates: NOT DETECTED
Cocaine: NOT DETECTED

## 2011-08-14 LAB — CBC
HCT: 35.4 % — ABNORMAL LOW (ref 36.0–46.0)
MCHC: 33.9 g/dL (ref 30.0–36.0)
RDW: 15.4 % (ref 11.5–15.5)

## 2011-08-14 MED ORDER — PROMETHAZINE HCL 25 MG RE SUPP: 25.0000 mg | Freq: Four times a day (QID) | RECTAL | Status: DC | PRN

## 2011-08-14 MED ORDER — SODIUM CHLORIDE 0.9 % IV BOLUS (SEPSIS): 1000.0000 mL | Freq: Once | INTRAVENOUS | Status: DC

## 2011-08-14 MED ORDER — PROMETHAZINE HCL 25 MG/ML IJ SOLN
12.5000 mg | Freq: Once | INTRAMUSCULAR | Status: AC
  Administered 2011-08-14: 12.5 mg via INTRAMUSCULAR
  Filled 2011-08-14: qty 1
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
## 2011-08-14 NOTE — ED Notes (Signed)
Pt given ice chips and water, will recheck on pt to see if able to keep down. Pt stating she wants something to eat, informed she needs to keep down fluids first.
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
## 2011-08-14 NOTE — ED Provider Notes (Signed)
Medical screening examination/treatment/procedure(s) were performed by non-physician practitioner and as supervising physician I was immediately available for consultation/collaboration.   Celene Kras, MD 08/14/11 1213

## 2011-08-14 NOTE — ED Notes (Signed)
Pt given discharge instructions/explained, in no distress, escorted to discharge window. 

## 2011-08-21 ENCOUNTER — Ambulatory Visit (INDEPENDENT_AMBULATORY_CARE_PROVIDER_SITE_OTHER): Payer: Medicaid Other | Admitting: *Deleted

## 2011-08-21 ENCOUNTER — Ambulatory Visit (INDEPENDENT_AMBULATORY_CARE_PROVIDER_SITE_OTHER): Payer: Medicaid Other | Admitting: Internal Medicine

## 2011-08-21 ENCOUNTER — Encounter: Payer: Self-pay | Admitting: Internal Medicine

## 2011-08-21 ENCOUNTER — Other Ambulatory Visit (HOSPITAL_COMMUNITY)
Admission: RE | Admit: 2011-08-21 | Discharge: 2011-08-21 | Disposition: A | Discharge status: Home / Self Care | Payer: Medicaid Other | Source: Ambulatory Visit | Attending: Internal Medicine | Admitting: Internal Medicine

## 2011-08-21 VITALS — BP 127/85 | HR 101 | Temp 97.5°F | Wt 235.0 lb

## 2011-08-21 DIAGNOSIS — B20 Human immunodeficiency virus [HIV] disease: Secondary | ICD-10-CM

## 2011-08-21 DIAGNOSIS — Z124 Encounter for screening for malignant neoplasm of cervix: Secondary | ICD-10-CM

## 2011-08-21 DIAGNOSIS — Z01419 Encounter for gynecological examination (general) (routine) without abnormal findings: Secondary | ICD-10-CM | POA: Insufficient documentation

## 2011-08-21 DIAGNOSIS — Z0000 Encounter for general adult medical examination without abnormal findings: Secondary | ICD-10-CM

## 2011-08-21 NOTE — Patient Instructions (Signed)
Pt's PAP smear results will be ready in about a week.  They will be mailed to you.

## 2011-08-21 NOTE — Progress Notes (Signed)
Pt given educational materials re:  HIV and women, BSE, heart disease, PAP smears and self-esteem.  Pt given condoms.

## 2011-08-21 NOTE — Progress Notes (Signed)
PAP smear completed.  Lab charge completed under Dr. Ephriam Knuckles visit.  Follow up in one year, or as indicated by Pap results. Pt given educational materials re: HIV and women, BSE, heart disease, PAP smears and self-esteem.  Pt given condoms

## 2011-08-21 NOTE — Patient Instructions (Addendum)
Your PAP smear results will be ready in about a week.  I will mail them to you.  Thank you for coming to the Center for your care.  Codee Bloodworth, RN 

## 2011-08-24 NOTE — Progress Notes (Signed)
  Subjective:    Patient ID: [name], female    DOB: [birth year], 46 y.o.   MRN: [id number]  HPI She is here for her second visit after being in [place].  She continues to be ARV naive with a good CD4 count and low level viremia.  No recent complaints.  She has been in the ED twice but no admissions.  No recent STIs or new issues.     Review of Systems  Constitutional: Negative for fever, chills, activity change, appetite change, fatigue and unexpected weight change.  HENT: Negative for sore throat and trouble swallowing.   Respiratory: Negative for cough and shortness of breath.   Cardiovascular: Negative for chest pain, palpitations and leg swelling.  Gastrointestinal: Negative for nausea, abdominal pain, diarrhea and constipation.  Musculoskeletal: Negative for myalgias, joint swelling and arthralgias.  Skin: Negative for rash.  Neurological: Negative for dizziness, weakness and headaches.  Hematological: Negative for adenopathy.  Psychiatric/Behavioral: Negative for dysphoric mood. The patient is not nervous/anxious.        Objective:   Physical Exam  Constitutional: She appears well-developed and well-nourished. No distress.  HENT:  Mouth/Throat: Oropharynx is clear and moist. No oropharyngeal exudate.  Cardiovascular: Normal rate, regular rhythm and normal heart sounds.  Exam reveals no gallop and no friction rub.   No murmur heard. Pulmonary/Chest: Effort normal and breath sounds normal. No respiratory distress. She has no wheezes. She has no rales.  Abdominal: Soft. Bowel sounds are normal. She exhibits no distension. There is no tenderness. There is no rebound.  Skin: Skin is warm and dry. No rash noted.  Psychiatric:       Flat affect          Assessment & Plan:

## 2011-08-24 NOTE — Assessment & Plan Note (Addendum)
I again discussed the benefits of treatment in LTNP and the opportunity to avoid possibly complications in the future.  She though continues to not be interested in treatment at this time.  She refused her vaccines.  PAP today.    Primary care issues, BP, per her PCP.  She does not remember the name of her PCP.  She was reminded to use condoms with all sexual activity.

## 2011-08-25 ENCOUNTER — Encounter: Payer: Self-pay | Admitting: *Deleted

## 2011-09-12 ENCOUNTER — Emergency Department (HOSPITAL_COMMUNITY)
Admission: EM | Admit: 2011-09-12 | Discharge: 2011-09-12 | Disposition: A | Discharge status: Home / Self Care | Payer: Medicaid Other | Attending: Emergency Medicine | Admitting: Emergency Medicine

## 2011-09-12 ENCOUNTER — Encounter (HOSPITAL_COMMUNITY): Payer: Self-pay | Admitting: Emergency Medicine

## 2011-09-12 DIAGNOSIS — F1910 Other psychoactive substance abuse, uncomplicated: Secondary | ICD-10-CM | POA: Insufficient documentation

## 2011-09-12 DIAGNOSIS — Z8659 Personal history of other mental and behavioral disorders: Secondary | ICD-10-CM | POA: Insufficient documentation

## 2011-09-12 DIAGNOSIS — F17200 Nicotine dependence, unspecified, uncomplicated: Secondary | ICD-10-CM | POA: Insufficient documentation

## 2011-09-12 DIAGNOSIS — F319 Bipolar disorder, unspecified: Secondary | ICD-10-CM | POA: Insufficient documentation

## 2011-09-12 DIAGNOSIS — R7309 Other abnormal glucose: Secondary | ICD-10-CM | POA: Insufficient documentation

## 2011-09-12 DIAGNOSIS — R739 Hyperglycemia, unspecified: Secondary | ICD-10-CM

## 2011-09-12 DIAGNOSIS — R112 Nausea with vomiting, unspecified: Secondary | ICD-10-CM | POA: Insufficient documentation

## 2011-09-12 LAB — URINALYSIS, ROUTINE W REFLEX MICROSCOPIC
Bilirubin Urine: NEGATIVE
Glucose, UA: NEGATIVE mg/dL
Hgb urine dipstick: NEGATIVE
Ketones, ur: NEGATIVE mg/dL
Leukocytes, UA: NEGATIVE
Protein, ur: NEGATIVE mg/dL
pH: 6 (ref 5.0–8.0)

## 2011-09-12 LAB — ETHANOL: Alcohol, Ethyl (B): <11 mg/dL (ref 0–11)

## 2011-09-12 LAB — RAPID URINE DRUG SCREEN, HOSP PERFORMED
Amphetamines: NOT DETECTED
Barbiturates: NOT DETECTED
Benzodiazepines: NOT DETECTED
Cocaine: NOT DETECTED
Tetrahydrocannabinol: NOT DETECTED

## 2011-09-12 LAB — GLUCOSE, CAPILLARY: Glucose-Capillary: 129 mg/dL — ABNORMAL HIGH (ref 70–99)

## 2011-09-12 MED ORDER — ONDANSETRON 8 MG PO TBDP
8.0000 mg | ORAL_TABLET | Freq: Once | ORAL | Status: AC
  Administered 2011-09-12: 8 mg via ORAL
  Filled 2011-09-12: qty 1

## 2011-09-12 NOTE — ED Provider Notes (Signed)
History     CSN: [id number]  Arrival date & time 09/12/11  0803   First MD Initiated Contact with Patient 09/12/11 [phone]      Chief Complaint  Patient presents with  . Drug Problem    hx drug use    (Consider location/radiation/quality/duration/timing/severity/associated sxs/prior treatment) HPI Comments: Patient presents with nausea, vomiting, and headache. Last night, patient was with some friends and smoked marijuana, drank 2 shots of alcohol, and also sniffed "something white", pt is unsure of what it was but was told it was cocaine. Pt states she does not usually use cocaine or alcohol, but uses marijuana regularly. Afterwards, pt vomited last night and this morning, 4 times total, vomit was green. Pt is worried that there was poison in the drugs she used and wanted to get checked to what it was. Pt states that this morning she has also experienced a headache on the right side of her head that is throbbing. Pt also reports increased frequency of urination that has been present for a year. LNMP was 1 week ago. Pt states she is moving to [facility] tomorrow and is considering drug rehab but does not currently have any plans with a specific program. Pt denies fever, chest pain, shortness of breath, change in vision or diplopia, weakness or numbness of extremities, abdominal pain, change in bowel movements, pain or difficulty with urination.  Patient is a 46 y.o. female presenting with drug problem. The history is provided by the patient.  Drug Problem Associated symptoms include headaches, nausea and vomiting. Pertinent negatives include no abdominal pain, chest pain, coughing, fever or numbness.    Past Medical History  Diagnosis Date  . Bipolar disorder   . Schizoaffective disorder   . Suicide and self-inflicted injury     Past Surgical History  Procedure Date  . 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 Bowel obstruction   . Surgery r/t bowel obstruction     No family history on  file.  History  Substance Use Topics  . Smoking status: Current Everyday Smoker -- 0.5 packs/day for 30 years    Types: Cigarettes  . Smokeless tobacco: Never Used  . Alcohol Use: 3.5 oz/week    7 drink(s) per week    OB History    Grav Para Term Preterm Abortions TAB SAB Ect Mult Living                  Review of Systems  Constitutional: Negative for fever.  Respiratory: Negative for cough and shortness of breath.   Cardiovascular: Negative for chest pain.  Gastrointestinal: Positive for nausea and vomiting. Negative for abdominal pain, diarrhea, constipation and blood in stool.  Genitourinary: Positive for frequency. Negative for dysuria, urgency and difficulty urinating.  Neurological: Positive for headaches. Negative for tremors and numbness.    Allergies  Lithium and Thorazine  Home Medications   Current Outpatient Rx  Name Route Sig Dispense Refill  . QUETIAPINE FUMARATE 200 MG PO TABS Oral Take 200 mg by mouth 3 (three) times daily.     Marland Kitchen ZOLPIDEM TARTRATE 10 MG PO TABS Oral Take 10 mg by mouth at bedtime as needed. For sleep      BP 131/77  Pulse 94  Temp 97.6 F (36.4 C) (Oral)  Resp 18  Ht 5\' 4"  (1.626 m)  Wt 233 lb 12.8 oz (106.051 kg)  BMI 40.13 kg/m2  SpO2 100%  LMP 09/06/2011  Physical Exam  Nursing note and vitals reviewed. Constitutional: She appears  well-developed and well-nourished. No distress.  HENT:  Head: Normocephalic and atraumatic.  Neck: Neck supple.  Cardiovascular: Normal rate and regular rhythm.   Pulmonary/Chest: Effort normal and breath sounds normal. No respiratory distress. She has no wheezes. She has no rales.  Abdominal: Soft. She exhibits no distension. There is no tenderness. There is no rebound and no guarding.  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  Skin: She is not diaphoretic.    ED Course  Procedures (including critical care time)  Labs Reviewed  URINALYSIS, ROUTINE  W REFLEX MICROSCOPIC - Abnormal; Notable for the following:    APPearance CLOUDY (*)     Specific Gravity, Urine 1.031 (*)     All other components within normal limits  SALICYLATE LEVEL - Abnormal; Notable for the following:    Salicylate Lvl <2.0 (*)     All other components within normal limits  GLUCOSE, CAPILLARY - Abnormal; Notable for the following:    Glucose-Capillary 129 (*)     All other components within normal limits  PREGNANCY, URINE  URINE RAPID DRUG SCREEN (HOSP PERFORMED)  ETHANOL  ACETAMINOPHEN LEVEL   No results found.  10:54 AM Patient reports she is feeling much better after zofran.    1. Nausea and vomiting   2. Substance abuse   3. Hyperglycemia       MDM  Pt reports substance abuse overnight followed by N/V.  Pt was concerned about the substances she may have smoked or snorted given her symptoms.  Her drug screen was completely negative, which means I have no idea what the patient was smoking or snorting last night.  ETOH, APAP, salicylates negative.  Pt is planning to go to [facility] tomorrow to move in with father and possibly start drug rehab program.   Discussed all results with patient.  Pt given return precautions.  Pt verbalizes understanding and agrees with plan.           [place], [place] 09/12/11 1057

## 2011-09-12 NOTE — ED Provider Notes (Signed)
Medical screening examination/treatment/procedure(s) were performed by non-physician practitioner and as supervising physician I was immediately available for consultation/collaboration. Devoria Albe, MD, Armando Gang   Ward Givens, MD 09/12/11 [phone]

## 2011-09-12 NOTE — ED Notes (Signed)
Pt presents w/ Nausea w/ emesis. States was at friends house last noc they were smoking marijuana and snorting cocaine. Pt thinks marijuana was laced w/ cocaine and wants to be drug tested. Going to [place] tomorrow to enter drug rehab program

## 2011-09-12 NOTE — ED Notes (Signed)
Pt reports smoking marijuana and snorting cocaine last night. Believes cocaine was a different drug (heroine), pt reports nausea and emesis 5 times since last night. Requesting drug test to see what she ingested.

## 2011-09-22 ENCOUNTER — Encounter (HOSPITAL_COMMUNITY): Payer: Self-pay | Admitting: Emergency Medicine

## 2011-09-22 ENCOUNTER — Emergency Department (HOSPITAL_COMMUNITY)
Admission: EM | Admit: 2011-09-22 | Discharge: 2011-09-22 | Disposition: A | Discharge status: Home / Self Care | Payer: Medicaid Other | Attending: Emergency Medicine | Admitting: Emergency Medicine

## 2011-09-22 DIAGNOSIS — F1920 Other psychoactive substance dependence, uncomplicated: Secondary | ICD-10-CM | POA: Insufficient documentation

## 2011-09-22 DIAGNOSIS — F17200 Nicotine dependence, unspecified, uncomplicated: Secondary | ICD-10-CM | POA: Insufficient documentation

## 2011-09-22 DIAGNOSIS — F259 Schizoaffective disorder, unspecified: Secondary | ICD-10-CM | POA: Insufficient documentation

## 2011-09-22 DIAGNOSIS — F319 Bipolar disorder, unspecified: Secondary | ICD-10-CM | POA: Insufficient documentation

## 2011-09-22 LAB — RAPID URINE DRUG SCREEN, HOSP PERFORMED
Amphetamines: NOT DETECTED
Benzodiazepines: NOT DETECTED
Tetrahydrocannabinol: NOT DETECTED

## 2011-09-22 MED ORDER — ACETAMINOPHEN 500 MG PO TABS
1000.0000 mg | ORAL_TABLET | Freq: Once | ORAL | Status: AC
  Administered 2011-09-22: 1000 mg via ORAL
  Filled 2011-09-22: qty 2

## 2011-09-22 NOTE — ED Provider Notes (Signed)
History     CSN: [id number]  Arrival date & time 09/22/11  1914   First MD Initiated Contact with Patient 09/22/11 1032      Chief Complaint  Patient presents with  . Headache  . Nausea    (Consider location/radiation/quality/duration/timing/severity/associated sxs/prior treatment) HPI  46 y/o woman with Bipolar and schizophrenia, presenting with headache and hallucinations.  Pt admits to abusing marijuana and smoke a joint around 1 AM this morning.  About 30 minutes later, began experiencing headaches, nausea, diarrhea, and visual hallucinations.  The headaches have increased in intensity since then.  The hallucinations are of people who are not actually present.  Her hallucinations in the past with schizophrenia are voices.  She does not hear voices now.  She denies chest pain and dyspnea.  Past Medical History  Diagnosis Date  . Bipolar disorder   . Schizoaffective disorder   . Suicide and self-inflicted injury     Past Surgical History  Procedure Date  . 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 Bowel obstruction   . Surgery r/t bowel obstruction     History reviewed. No pertinent family history.  History  Substance Use Topics  . Smoking status: Current Everyday Smoker -- 0.5 packs/day for 30 years    Types: Cigarettes  . Smokeless tobacco: Never Used  . Alcohol Use: 3.5 oz/week    7 drink(s) per week    OB History    Grav Para Term Preterm Abortions TAB SAB Ect Mult Living                  Review of Systems  All other systems reviewed and are negative.    Allergies  Lithium and Thorazine  Home Medications   Current Outpatient Rx  Name Route Sig Dispense Refill  . QUETIAPINE FUMARATE 200 MG PO TABS Oral Take 200 mg by mouth 3 (three) times daily.     Marland Kitchen ZOLPIDEM TARTRATE 10 MG PO TABS Oral Take 10 mg by mouth at bedtime as needed. For sleep      BP 144/92  Pulse 102  Temp 98.3 F (36.8 C) (Oral)  Resp 20  SpO2 96%  LMP 09/06/2011  Physical Exam    Constitutional: She is oriented to person, place, and time. She appears well-developed and well-nourished. No distress.  HENT:  Head: Normocephalic and atraumatic.  Mouth/Throat: Uvula is midline, oropharynx is clear and moist and mucous membranes are normal.  Eyes: Conjunctivae and EOM are normal. Pupils are equal, round, and reactive to light.  Neck: Normal range of motion and full passive range of motion without pain. Neck supple.  Cardiovascular: Normal rate, regular rhythm and normal heart sounds.   No murmur heard. Pulmonary/Chest: Effort normal and breath sounds normal.  Abdominal: Soft. Bowel sounds are normal. She exhibits no mass. There is generalized tenderness.  Musculoskeletal: Normal range of motion.       Above elbow amputation on left  Lymphadenopathy:    She has no cervical adenopathy.  Neurological: She is alert and oriented to person, place, and time. She has normal strength and normal reflexes. No cranial nerve deficit or sensory deficit. Coordination normal.  Skin: Skin is warm, dry and intact.  Psychiatric: She has a normal mood and affect. Her speech is normal and behavior is normal. Judgment and thought content normal.    ED Course  Procedures (including critical care time)  11:28 AM - Pt reassessed.  Headache improved with Tylenol.  Pt reports marijuana use that she  gets from others.  Is ready to quit and will enroll in a rehab program.   Labs Reviewed  URINE RAPID DRUG SCREEN (HOSP PERFORMED)   No results found.   1. Drug abuse and dependence       MDM  UDS was negative for both marijuana and cocaine, despite pt's report of smoking marijuana this AM and a few days ago.  It is possible she is smoking something completely different and being told it is marijuana.  The headache is improved and there are no neurological deficits demonstrable on exam so she is safe for d/c.  Pt instructed on use of OTC pain medications for relief of headache.  Lollie Sails, MD 09/22/11 1135

## 2011-09-22 NOTE — ED Notes (Signed)
Pt provided bag lunch 

## 2011-09-22 NOTE — ED Notes (Signed)
Pt sts smoked some weed last night that she thinks had cocaine in it and now she is having nausea and a HA; pt sts some hallucinations

## 2011-09-25 NOTE — ED Provider Notes (Signed)
I saw and evaluated the patient, reviewed the resident's note and I agree with the findings and plan.Cyndra Numbers, MD 09/25/11 1535

## 2011-10-08 ENCOUNTER — Encounter (HOSPITAL_COMMUNITY): Payer: Self-pay | Admitting: *Deleted

## 2011-10-08 ENCOUNTER — Emergency Department (HOSPITAL_COMMUNITY)
Admission: EM | Admit: 2011-10-08 | Discharge: 2011-10-08 | Disposition: A | Discharge status: Home / Self Care | Payer: Medicaid Other | Attending: Emergency Medicine | Admitting: Emergency Medicine

## 2011-10-08 DIAGNOSIS — F1220 Cannabis dependence, uncomplicated: Secondary | ICD-10-CM | POA: Insufficient documentation

## 2011-10-08 DIAGNOSIS — I10 Essential (primary) hypertension: Secondary | ICD-10-CM | POA: Insufficient documentation

## 2011-10-08 DIAGNOSIS — F1010 Alcohol abuse, uncomplicated: Secondary | ICD-10-CM | POA: Insufficient documentation

## 2011-10-08 DIAGNOSIS — F259 Schizoaffective disorder, unspecified: Secondary | ICD-10-CM | POA: Insufficient documentation

## 2011-10-08 DIAGNOSIS — F17200 Nicotine dependence, unspecified, uncomplicated: Secondary | ICD-10-CM | POA: Insufficient documentation

## 2011-10-08 DIAGNOSIS — F1990 Other psychoactive substance use, unspecified, uncomplicated: Secondary | ICD-10-CM

## 2011-10-08 DIAGNOSIS — Z87820 Personal history of traumatic brain injury: Secondary | ICD-10-CM | POA: Insufficient documentation

## 2011-10-08 DIAGNOSIS — F1420 Cocaine dependence, uncomplicated: Secondary | ICD-10-CM | POA: Insufficient documentation

## 2011-10-08 HISTORY — DX: Unspecified intracranial injury with loss of consciousness status unknown, initial encounter: S06.9XAA

## 2011-10-08 HISTORY — DX: Essential (primary) hypertension: I10

## 2011-10-08 HISTORY — DX: Unspecified intracranial injury with loss of consciousness of unspecified duration, initial encounter: S06.9X9A

## 2011-10-08 LAB — CBC WITH DIFFERENTIAL/PLATELET
Basophils Absolute: 0 10*3/uL (ref 0.0–0.1)
Eosinophils Absolute: 0.2 10*3/uL (ref 0.0–0.7)
Eosinophils Relative: 3 % (ref 0–5)
Lymphocytes Relative: 44 % (ref 12–46)
MCV: 80.9 fL (ref 78.0–100.0)
Neutrophils Relative %: 46 % (ref 43–77)
Platelets: 332 10*3/uL (ref 150–400)
RBC: 4.55 MIL/uL (ref 3.87–5.11)
RDW: 15.2 % (ref 11.5–15.5)
WBC: 6.4 10*3/uL (ref 4.0–10.5)

## 2011-10-08 LAB — RAPID URINE DRUG SCREEN, HOSP PERFORMED
Amphetamines: NOT DETECTED
Barbiturates: NOT DETECTED
Benzodiazepines: NOT DETECTED

## 2011-10-08 LAB — ETHANOL: Alcohol, Ethyl (B): <11 mg/dL (ref 0–11)

## 2011-10-08 LAB — COMPREHENSIVE METABOLIC PANEL
ALT: 21 U/L (ref 0–35)
AST: 24 U/L (ref 0–37)
Alkaline Phosphatase: 91 U/L (ref 39–117)
CO2: 26 mEq/L (ref 19–32)
Calcium: 9.2 mg/dL (ref 8.4–10.5)
Potassium: 3.8 mEq/L (ref 3.5–5.1)
Sodium: 140 mEq/L (ref 135–145)
Total Protein: 7.1 g/dL (ref 6.0–8.3)

## 2011-10-08 NOTE — ED Notes (Signed)
Patient states she is here for detox from marijuana.  Patient states everytime she smokes she gets diarrhea and sees black spots. She is concerned that it may be laced with cocaine.  She is also having pain in her legs.  Patient last use was 2 days.

## 2011-10-08 NOTE — ED Provider Notes (Signed)
History  This chart was scribed for Jones Skene, MD by Albertha Ghee Rifaie. This patient was seen in room TR07C/TR07C and the patient's care was started at 11:46   CSN: [id number]  Arrival date & time 10/08/11  1146   None     Chief Complaint  Patient presents with  . Medical Clearance    The history is provided by the patient. No language interpreter was used.    [name] is a 46 y.o. female who presents to the Emergency Department asking for detox for marijuana and cocaine. Pt states that she smoked one marijuana joint possibly laced with cocaine and drank EtOH last night. 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 She consumes 1 marijuana joint per day and about 3 bottles of beer. She says she has been hanging around "the wrong people." She states that she is currently taking Seroquel for her Schizoaffective disorder and denies any recent reoccurrence of hallucinations, SI or HI. She is currently living on her own with her boyfriend.  Pt also c/o pain in RLE described as a cramping sensation that she attributes to laying on it too long. She denies fever, chills, nausea, emesis and CP. Pt is a current everyday smoker.   Past Medical History  Diagnosis Date  . Bipolar disorder   . Schizoaffective disorder   . Suicide and self-inflicted injury   . Hypertension   . Traumatic brain injury     Past Surgical History  Procedure Date  . 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 Bowel obstruction   . Surgery r/t bowel obstruction     No family history on file.  History  Substance Use Topics  . Smoking status: Current Every Day Smoker -- 0.5 packs/day for 30 years    Types: Cigarettes  . Smokeless tobacco: Never Used  . Alcohol Use: 3.5 oz/week    7 drink(s) per week    No OB history provided  Review of Systems  REVIEW OF SYSTEMS:   1.) CONSTITUTIONAL: No fever, chills or systemic signs of infection. No recent, unexplained  weight changes.   2.) HEENT: No facial pain, sinus congestion or rhinorrhea is reported. Patient is denying any acute visual or hearing deficits. No sore throat or difficulty swallowing.  3.) NECK: No swelling or masses are reported.   4.) PULMONARY: No cough sputum production or shortness of breath was reported.   5.) CARDIAC: No palpitations, chest pain or pressure.   6.) ABDOMINAL: Denies abdominal pain, nausea, vomiting or diarrhea. No Hematochezia or melena.  7.) BACK: Denying any flank or CVA tenderness. No specific thoracic or lumbar pain.   8.) EXTREMITIES: Denying any extremity edema pitting or rash.   9.) NEUROLOGIC: Denying any focal or lateralizing neurologic impairments.     10.) SKIN: No rashes, itching   Allergies  Lithium and Thorazine  Home Medications   Current Outpatient Rx  Name Route Sig Dispense Refill  . QUETIAPINE FUMARATE 200 MG PO TABS Oral Take 200 mg by mouth 3 (three) times daily.     Marland Kitchen ZOLPIDEM TARTRATE 10 MG PO TABS Oral Take 10 mg by mouth at bedtime as needed. For sleep      Triage Vitals: BP 109/95  Pulse 94  Temp 98.2 F (36.8 C) (Oral)  Resp 18  Ht 5\' 4"  (1.626 m)  Wt 234 lb (106.142 kg)  BMI 40.17 kg/m2  SpO2 97%  LMP 09/06/2011  Physical Exam  Nursing notes reviewed.  Electronic medical record reviewed. VITAL SIGNS:   Filed Vitals:   10/08/11 1202 10/08/11 1205  BP:  109/95  Pulse:  94  Temp:  98.2 F (36.8 C)  TempSrc:  Oral  Resp:  18  Height: 5\' 4"  (1.626 m)   Weight: 234 lb (106.142 kg)   SpO2:  97%   CONSTITUTIONAL: Awake, oriented, appears non-toxic HENT: Atraumatic, normocephalic, oral mucosa pink and moist, airway patent. Nares patent without drainage. External ears normal. EYES: Conjunctiva clear, EOMI, PERRLA NECK: Trachea midline, non-tender, supple CARDIOVASCULAR: Normal heart rate, Normal rhythm, No murmurs, rubs, gallops PULMONARY/CHEST: Clear to auscultation, no rhonchi, wheezes, or rales. Symmetrical  breath sounds. Non-tender. ABDOMINAL: Non-distended, soft, non-tender - no rebound or guarding.  BS normal. NEUROLOGIC: Non-focal, moving all four extremities, no gross sensory or motor deficits. EXTREMITIES: No clubbing, cyanosis, or edema SKIN: Warm, Dry, No erythema, No rash  ED Course  Procedures (including critical care time)  DIAGNOSTIC STUDIES: None-performed   COORDINATION OF CARE: 2:53 PM- Discussed treatment plan which includes re-evaluation by ACT with pt at bedside and pt agreed to plan.  3:03PM-Pt's drug screen is negative. Will discharge home.  Labs Reviewed  COMPREHENSIVE METABOLIC PANEL - Abnormal; Notable for the following:    Glucose, Bld 128 (*)     Creatinine, Ser 1.12 (*)     Albumin 3.4 (*)     Total Bilirubin 0.2 (*)     GFR calc non Af Amer 59 (*)     GFR calc Af Amer 68 (*)     All other components within normal limits  CBC WITH DIFFERENTIAL  URINE RAPID DRUG SCREEN (HOSP PERFORMED)  ETHANOL  LAB REPORT - SCANNED   No results found.   No diagnosis found.    MDM  [name] is a 46 y.o. female presents wanting to know if her "blunt" was laced with cocaine.  She actually didn't want inpatient treatment or detox.  She simply wanted to know if she'd been exposed to cocaine by "hanging around the wrong people."  Pt drug screen was completely normal - suggesting her "blunt" wasn't laced with marijuana either.  Schizoaffective disorder seems well controlled with SI, no hallucinations and the pt looks well kempt. PT DC to home in good condition.     I personally performed the services described in this documentation, which was scribed in my presence. The recorded information has been reviewed and considered. Jones Skene, M.D.      Jones Skene, MD 10/12/11 1356

## 2011-11-10 ENCOUNTER — Emergency Department (HOSPITAL_COMMUNITY)
Admission: EM | Admit: 2011-11-10 | Discharge: 2011-11-10 | Disposition: A | Discharge status: Home / Self Care | Payer: Medicaid Other | Attending: Emergency Medicine | Admitting: Emergency Medicine

## 2011-11-10 ENCOUNTER — Encounter (HOSPITAL_COMMUNITY): Payer: Self-pay

## 2011-11-10 ENCOUNTER — Emergency Department (HOSPITAL_COMMUNITY)
Admission: EM | Admit: 2011-11-10 | Discharge: 2011-11-10 | Discharge status: Against Medical Advice | Payer: Medicaid Other | Source: Home / Self Care

## 2011-11-10 DIAGNOSIS — I10 Essential (primary) hypertension: Secondary | ICD-10-CM | POA: Insufficient documentation

## 2011-11-10 DIAGNOSIS — R197 Diarrhea, unspecified: Secondary | ICD-10-CM | POA: Insufficient documentation

## 2011-11-10 DIAGNOSIS — F1910 Other psychoactive substance abuse, uncomplicated: Secondary | ICD-10-CM

## 2011-11-10 DIAGNOSIS — F319 Bipolar disorder, unspecified: Secondary | ICD-10-CM | POA: Insufficient documentation

## 2011-11-10 DIAGNOSIS — R1110 Vomiting, unspecified: Secondary | ICD-10-CM | POA: Insufficient documentation

## 2011-11-10 DIAGNOSIS — Z8659 Personal history of other mental and behavioral disorders: Secondary | ICD-10-CM | POA: Insufficient documentation

## 2011-11-10 HISTORY — DX: Other psychoactive substance abuse, uncomplicated: F19.10

## 2011-11-10 LAB — CBC
Hemoglobin: 13.5 g/dL (ref 12.0–15.0)
MCH: 27.6 pg (ref 26.0–34.0)
MCHC: 34.7 g/dL (ref 30.0–36.0)
Platelets: 317 10*3/uL (ref 150–400)
RBC: 4.9 MIL/uL (ref 3.87–5.11)

## 2011-11-10 LAB — COMPREHENSIVE METABOLIC PANEL
ALT: 14 U/L (ref 0–35)
AST: 18 U/L (ref 0–37)
Alkaline Phosphatase: 86 U/L (ref 39–117)
Calcium: 9 mg/dL (ref 8.4–10.5)
Potassium: 3.4 mEq/L — ABNORMAL LOW (ref 3.5–5.1)
Sodium: 132 mEq/L — ABNORMAL LOW (ref 135–145)
Total Protein: 7.4 g/dL (ref 6.0–8.3)

## 2011-11-10 LAB — ACETAMINOPHEN LEVEL: Acetaminophen (Tylenol), Serum: <15 ug/mL (ref 10–30)

## 2011-11-10 NOTE — ED Provider Notes (Signed)
Medical screening examination/treatment/procedure(s) were performed by non-physician practitioner and as supervising physician I was immediately available for consultation/collaboration.   Briane Birden, MD 11/10/11 2302 

## 2011-11-10 NOTE — ED Notes (Signed)
Pt is unsure what "white powder she ingested if at all"- Pt continues to decline assistance for mental evaluation Pt requesting only if she has had cocaine or any other traceable drug in her system.  Pt c/o of vomiting today as well. Pt denies ETOH today

## 2011-11-10 NOTE — ED Notes (Signed)
[name] long called pt is over there

## 2011-11-10 NOTE — ED Notes (Signed)
Pt presents with NAD- Pt reports drinking with friends yesterday and was told she was "sniffing powder and taking pain pills"- Pt has no memory of this.  Pt reports she is in a drug treatment out pt program.  Pt declines requesting treatment at this time- is requesting resources. Denies Si/HI

## 2011-11-10 NOTE — ED Notes (Signed)
Was drinking heavy last night and she took a lot of pills ? Coke and she is vomiting anf has diarrhea

## 2011-11-10 NOTE — ED Provider Notes (Signed)
History     CSN: [id number]  Arrival date & time 11/10/11  1416   First MD Initiated Contact with Patient 11/10/11 1520      Chief Complaint  Patient presents with  . Drug Overdose    (Consider location/radiation/quality/duration/timing/severity/associated sxs/prior treatment) HPI Comments: Patient presents today requesting a urine drug screen.  She states that she would like to know if she ingested cocaine last evening.  She states that she drank a large amount of alcohol last evening and told her friends to buy her some Cocaine.  However, she is unsure if she ingested the Cocaine or not.  She does not remember.  She vomited three times this morning, but no vomiting since that time.  She denies nausea at this time.  No abdominal pain.  No chest pain.  She is completely asymptomatic at this time.  She is currently in an outpatient drug rehab program and is not requesting any assistance with substance abuse at this time. She denies SI or HI.   The history is provided by the patient.    Past Medical History  Diagnosis Date  . Bipolar disorder   . Schizoaffective disorder   . Suicide and self-inflicted injury   . Hypertension   . Traumatic brain injury   . Substance abuse     Past Surgical History  Procedure Date  . 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 Bowel obstruction   . Surgery r/t bowel obstruction     No family history on file.  History  Substance Use Topics  . Smoking status: Current Every Day Smoker -- 0.5 packs/day for 30 years    Types: Cigarettes  . Smokeless tobacco: Never Used  . Alcohol Use: 3.5 oz/week    7 drink(s) per week    OB History    Grav Para Term Preterm Abortions TAB SAB Ect Mult Living                  Review of Systems  Constitutional: Negative for fever and chills.  Eyes: Negative for visual disturbance.  Respiratory: Negative for shortness of breath.   Cardiovascular: Negative for chest pain.  Gastrointestinal: Positive for nausea and  vomiting. Negative for abdominal pain.  Neurological: Negative for dizziness, light-headedness and headaches.  Psychiatric/Behavioral: Negative for suicidal ideas and hallucinations.    Allergies  Lithium; Risperidone and related; and Thorazine  Home Medications   Current Outpatient Rx  Name Route Sig Dispense Refill  . QUETIAPINE FUMARATE 200 MG PO TABS Oral Take 200 mg by mouth 3 (three) times daily.     Marland Kitchen ZOLPIDEM TARTRATE 10 MG PO TABS Oral Take 10 mg by mouth at bedtime as needed. For sleep      BP 128/74  Pulse 107  Temp 98.5 F (36.9 C) (Oral)  Resp 18  SpO2 100%  Physical Exam  Nursing note and vitals reviewed. Constitutional: She appears well-developed and well-nourished. No distress.  HENT:  Head: Normocephalic and atraumatic.  Mouth/Throat: Oropharynx is clear and moist.  Eyes: EOM are normal. Pupils are equal, round, and reactive to light.  Neck: Normal range of motion. Neck supple.  Cardiovascular: Normal rate, regular rhythm and normal heart sounds.   Pulmonary/Chest: Effort normal and breath sounds normal. No respiratory distress.  Abdominal: Soft. There is no tenderness.  Neurological: She is alert.  Skin: Skin is warm and dry. She is not diaphoretic.  Psychiatric: She has a normal mood and affect. Her speech is normal and behavior is normal.  She expresses no homicidal and no suicidal ideation. She expresses no suicidal plans and no homicidal plans.    ED Course  Procedures (including critical care time)   Labs Reviewed  URINE RAPID DRUG SCREEN (HOSP PERFORMED)  CBC  ACETAMINOPHEN LEVEL  COMPREHENSIVE METABOLIC PANEL  ETHANOL  SALICYLATE LEVEL   No results found.   No diagnosis found.    MDM  Patient presenting today requesting a UDS to determine if she used cocaine last evening.  Patient is asymptomatic at this time.  UDS negative for cocaine or any other drugs.  Patient reports that she is in an outpatient drug program and does not desire  any assistance with substance abuse at this time.  No SI or HI.   Return precautions discussed with the patient.        Pascal Lux [place], PA-C 11/10/11 1704

## 2011-11-13 IMAGING — CT CT CERVICAL SPINE W/O CM
3 of 6 series · 11 of 30 positions shown, 12 images · non-contrast
Comparison: None.

CT HEAD

CLINICAL DATA: Altered mental status.  Fell on her face.
Polysubstance abuse today.

CT HEAD WITHOUT CONTRAST
CT CERVICAL SPINE WITHOUT CONTRAST
TECHNIQUE: Multidetector CT imaging of the head and cervical spine
was performed following the standard protocol without intravenous
contrast.  Multiplanar CT image reconstructions of the cervical
spine were also generated.

[Series 3: recon 2: brain · axial · 0.47mm/px · z∈[+300,+347]mm · 2 of 80 slices shown]
[im 27/80  bone]
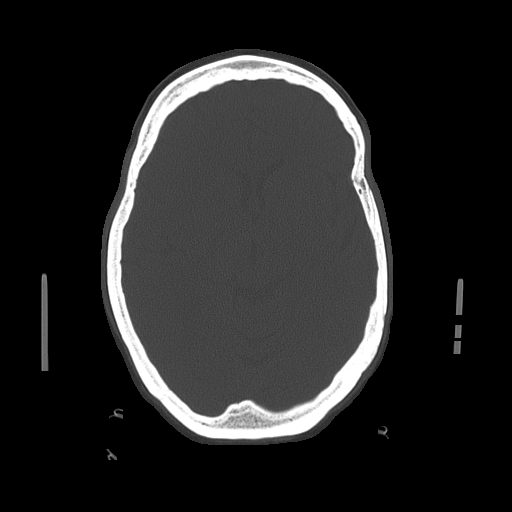
[im 53/80  bone]
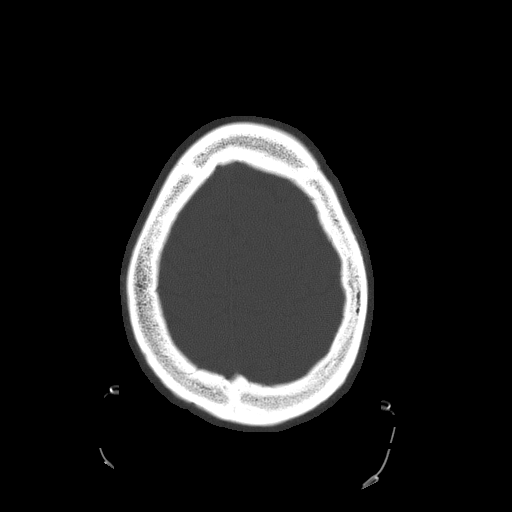

[Series 4: cervical spine · axial · 0.31mm/px · z∈[+52,+245]mm · 3 of 78 slices shown, 4 images]
[im 1/78  soft-tissue]
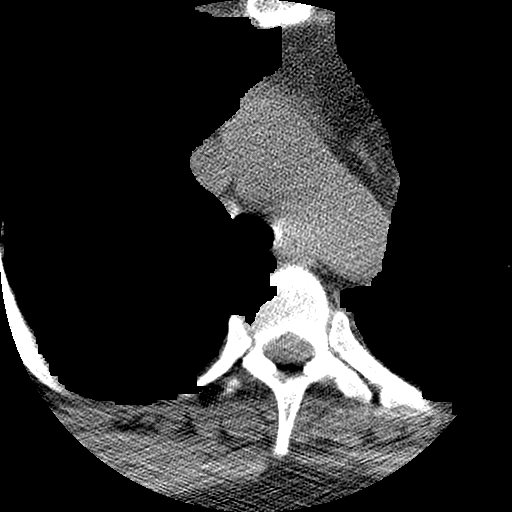
[im 1/78  bone]
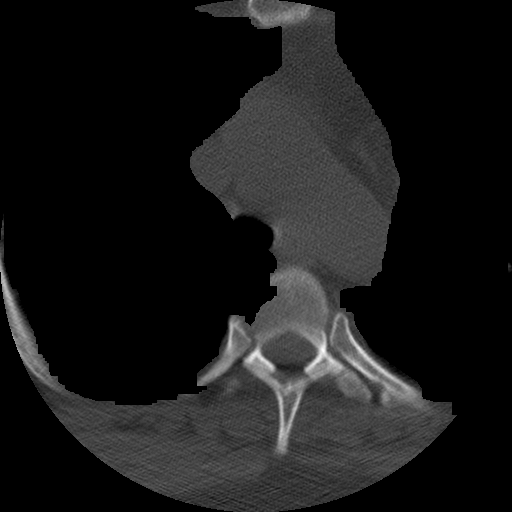
[im 39/78  bone]
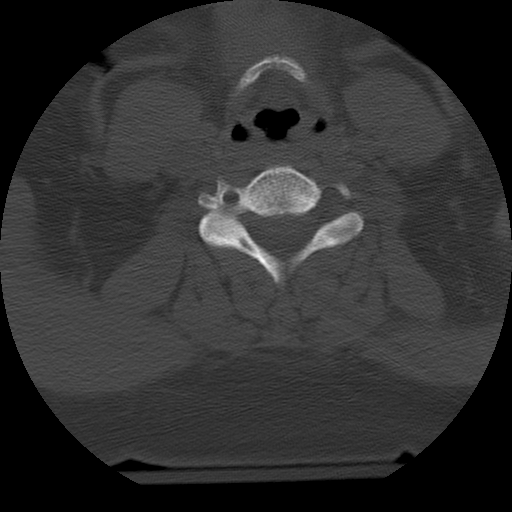
[im 78/78  bone]
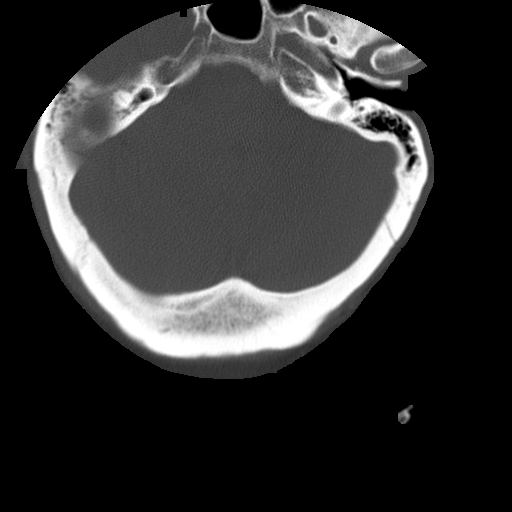

[Series 601: cor · coronal · 0.39mm/px · 6 of 44 slices shown]
[im 15/44  bone]
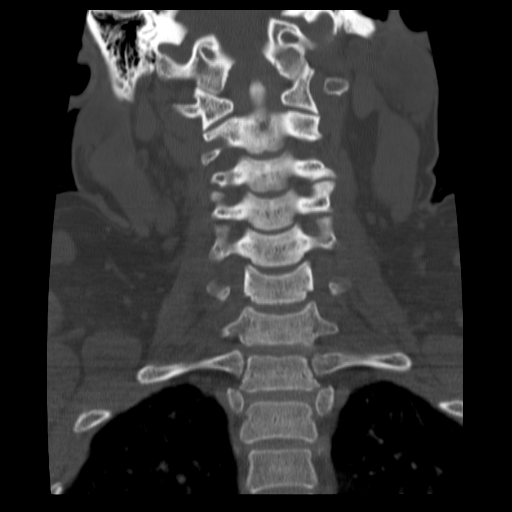
[im 17/44  soft-tissue]
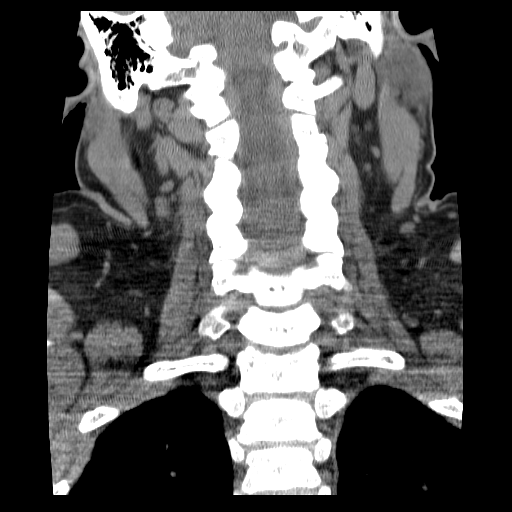
[im 18/44  bone]
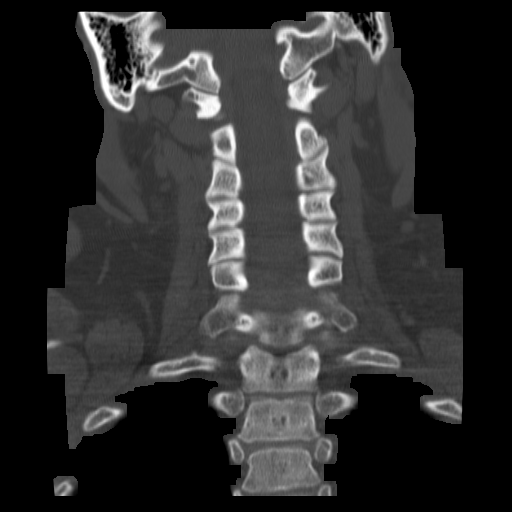
[im 22/44  bone]
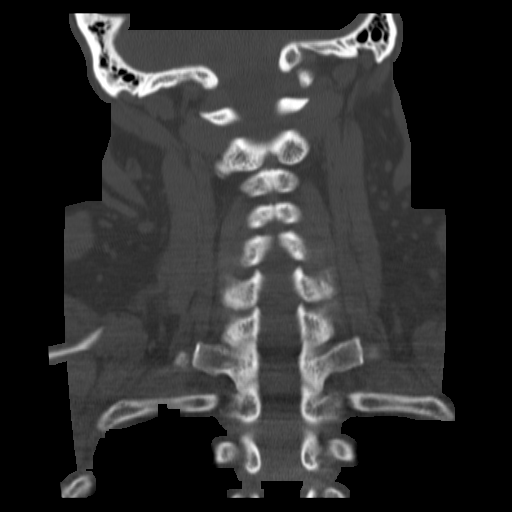
[im 26/44  bone]
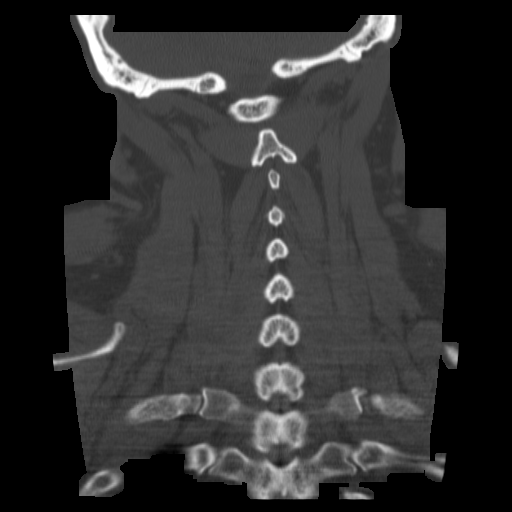
[im 29/44  bone]
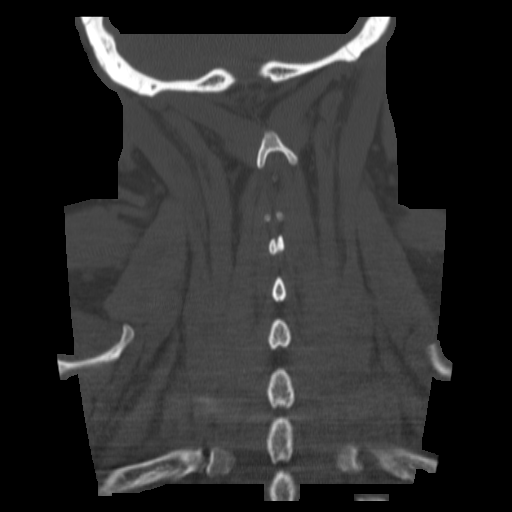

[11 of 30 positions shown; findings below may reference images not displayed]

FINDINGS: Comminuted nasal bone fracture with mild medial
displacement of the anterior fragment on the right.  The nasal bone
is not included in its entirety.  Normal appearing cerebral
hemispheres and posterior fossa structures.  Normal size and
position of the ventricles.  No skull fracture, intracranial
hemorrhage or paranasal sinus air-fluid levels.
IMPRESSION: 1.  Incompletely included comminuted nasal bone fracture.
2.  No skull fracture or intracranial hemorrhage.

CT CERVICAL SPINE
FINDINGS: Straightening of the normal cervical lordosis.  Mild
fragmented anterior spur formation at the C5-6 level.  No
prevertebral soft tissue swelling, fractures or subluxations.
Mildly prominent thymic tissue with interspersed fat in the
anterior mediastinum.  Small T3 vertebral body hemangioma.
IMPRESSION: 1.  No fracture or subluxation.
2.  Straightening of the normal cervical lordosis.
3.  Mild degenerative changes at the C5-6 level.

## 2011-12-11 ENCOUNTER — Emergency Department (HOSPITAL_COMMUNITY)
Admission: EM | Admit: 2011-12-11 | Discharge: 2011-12-12 | Disposition: A | Discharge status: Home / Self Care | Payer: Medicaid Other | Attending: Emergency Medicine | Admitting: Emergency Medicine

## 2011-12-11 ENCOUNTER — Encounter (HOSPITAL_COMMUNITY): Payer: Self-pay | Admitting: Family Medicine

## 2011-12-11 DIAGNOSIS — F259 Schizoaffective disorder, unspecified: Secondary | ICD-10-CM | POA: Insufficient documentation

## 2011-12-11 DIAGNOSIS — F17200 Nicotine dependence, unspecified, uncomplicated: Secondary | ICD-10-CM | POA: Insufficient documentation

## 2011-12-11 DIAGNOSIS — R45851 Suicidal ideations: Secondary | ICD-10-CM | POA: Insufficient documentation

## 2011-12-11 DIAGNOSIS — F319 Bipolar disorder, unspecified: Secondary | ICD-10-CM | POA: Insufficient documentation

## 2011-12-11 DIAGNOSIS — I10 Essential (primary) hypertension: Secondary | ICD-10-CM | POA: Insufficient documentation

## 2011-12-11 DIAGNOSIS — Z79899 Other long term (current) drug therapy: Secondary | ICD-10-CM | POA: Insufficient documentation

## 2011-12-11 DIAGNOSIS — Z87820 Personal history of traumatic brain injury: Secondary | ICD-10-CM | POA: Insufficient documentation

## 2011-12-11 DIAGNOSIS — F1910 Other psychoactive substance abuse, uncomplicated: Secondary | ICD-10-CM | POA: Insufficient documentation

## 2011-12-11 LAB — RAPID URINE DRUG SCREEN, HOSP PERFORMED
Barbiturates: NOT DETECTED
Cocaine: POSITIVE — AB
Tetrahydrocannabinol: POSITIVE — AB

## 2011-12-11 LAB — URINALYSIS, ROUTINE W REFLEX MICROSCOPIC
Bilirubin Urine: NEGATIVE
Hgb urine dipstick: NEGATIVE
Ketones, ur: NEGATIVE mg/dL
Specific Gravity, Urine: 1.01 (ref 1.005–1.030)
Urobilinogen, UA: 1 mg/dL (ref 0.0–1.0)

## 2011-12-11 MED ORDER — QUETIAPINE FUMARATE 100 MG PO TABS
200.0000 mg | ORAL_TABLET | Freq: Three times a day (TID) | ORAL | Status: DC
  Administered 2011-12-11: 200 mg via ORAL
  Filled 2011-12-11: qty 2

## 2011-12-11 MED ORDER — ZOLPIDEM TARTRATE 10 MG PO TABS: 10.0000 mg | ORAL_TABLET | Freq: Every evening | ORAL | Status: DC | PRN

## 2011-12-11 MED ORDER — ZIPRASIDONE MESYLATE 20 MG IM SOLR: 10.0000 mg | Freq: Four times a day (QID) | INTRAMUSCULAR | Status: DC | PRN

## 2011-12-11 MED ORDER — LORAZEPAM 1 MG PO TABS
1.0000 mg | ORAL_TABLET | Freq: Three times a day (TID) | ORAL | Status: DC | PRN
  Administered 2011-12-12: 1 mg via ORAL
  Filled 2011-12-11: qty 1

## 2011-12-11 MED ORDER — LORAZEPAM 1 MG PO TABS
1.0000 mg | ORAL_TABLET | Freq: Once | ORAL | Status: AC
  Administered 2011-12-11: 1 mg via ORAL
  Filled 2011-12-11: qty 1

## 2011-12-11 NOTE — ED Notes (Signed)
Pt. Refused getting blood drawn. RN, Drenda Freeze made aware.

## 2011-12-11 NOTE — ED Notes (Signed)
Patient states that she is suicidal. States that she been feeling suicidal this morning. States she has been out of her Prozac, Seroquel and Ambien for the past 3 days. Patient tearful.

## 2011-12-11 NOTE — ED Notes (Signed)
ACT team at bedside to evaluate patient.  

## 2011-12-11 NOTE — ED Provider Notes (Signed)
History     CSN: [id number]  Arrival date & time 12/11/11  2058   First MD Initiated Contact with Patient 12/11/11 2203      Chief Complaint  Patient presents with  . Suicidal    (Consider location/radiation/quality/duration/timing/severity/associated sxs/prior treatment) HPI The patient presents with suicidal ideation.  She states that she has been having suicidal thoughts for greater than one day.  She states that she is not currently taking her Prozac, Seroquel or Ambien.  She has a history of prior attempts, including once for bleeders ago that left her without her left arm.  She also describes ongoing auditory and visual hallucinations. The patient denies physical pain.  Past Medical History  Diagnosis Date  . Bipolar disorder   . Schizoaffective disorder   . Suicide and self-inflicted injury   . Hypertension   . Traumatic brain injury   . Substance abuse     Past Surgical History  Procedure Date  . 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 Bowel obstruction   . Surgery r/t bowel obstruction     No family history on file.  History  Substance Use Topics  . Smoking status: Current Every Day Smoker -- 0.5 packs/day for 30 years    Types: Cigarettes  . Smokeless tobacco: Never Used  . Alcohol Use: 3.5 oz/week    7 drink(s) per week    OB History    Grav Para Term Preterm Abortions TAB SAB Ect Mult Living                  Review of Systems  All other systems reviewed and are negative.    Allergies  Lithium; Risperidone and related; and Thorazine  Home Medications   Current Outpatient Rx  Name  Route  Sig  Dispense  Refill  . QUETIAPINE FUMARATE 200 MG PO TABS   Oral   Take 200 mg by mouth 3 (three) times daily.          Marland Kitchen ZOLPIDEM TARTRATE 10 MG PO TABS   Oral   Take 10 mg by mouth at bedtime as needed. For sleep           LMP 12/01/2011  Physical Exam  Nursing note and vitals reviewed. Constitutional: She appears distressed.       Diaphoretic  female, crying  HENT:  Head: Normocephalic and atraumatic.  Eyes: Conjunctivae normal are normal. Right eye exhibits no discharge. Left eye exhibits no discharge.  Neck: No tracheal deviation present.  Cardiovascular: Normal rate and regular rhythm.   Pulmonary/Chest: Effort normal. No stridor. No respiratory distress.  Abdominal: She exhibits no distension.  Musculoskeletal:       Left arm amputation below the elbow  Skin: Skin is warm and dry.  Psychiatric: Her mood appears anxious. Her speech is delayed. She is slowed and withdrawn. Cognition and memory are impaired. She expresses impulsivity. She exhibits a depressed mood. She expresses suicidal ideation. She expresses suicidal plans.    ED Course  Procedures (including critical care time)  Labs Reviewed  URINE RAPID DRUG SCREEN (HOSP PERFORMED) - Abnormal; Notable for the following:    Cocaine POSITIVE (*)     Tetrahydrocannabinol POSITIVE (*)     All other components within normal limits  URINALYSIS, ROUTINE W REFLEX MICROSCOPIC  CBC  COMPREHENSIVE METABOLIC PANEL  ETHANOL  ACETAMINOPHEN LEVEL  SALICYLATE LEVEL   No results found.   No diagnosis found.  Immediately after my initial evaluation the patient became agitated when she  was not allowed to make a phone call.  She calmed, and was cooperative.  MDM  This patient with history of schizophrenia, depression, prior suicide attempts now presents with concerns of ongoing suicidal ideation.  On exam the patient is uncomfortable, teary, anxious.  The patient is medically clear for psychiatric evaluation.   Gerhard Munch, MD 12/11/11 [phone]

## 2011-12-12 ENCOUNTER — Ambulatory Visit (HOSPITAL_COMMUNITY): Admission: EM | Admit: 2011-12-12 | Payer: Medicaid Other | Source: Ambulatory Visit | Admitting: Psychiatry

## 2011-12-12 LAB — COMPREHENSIVE METABOLIC PANEL
ALT: 31 U/L (ref 0–35)
Alkaline Phosphatase: 87 U/L (ref 39–117)
BUN: 10 mg/dL (ref 6–23)
CO2: 20 mEq/L (ref 19–32)
Chloride: 96 mEq/L (ref 96–112)
GFR calc Af Amer: >90 mL/min (ref >90)
GFR calc non Af Amer: 88 mL/min — ABNORMAL LOW (ref >90)
Glucose, Bld: 110 mg/dL — ABNORMAL HIGH (ref 70–99)
Potassium: 4.2 mEq/L (ref 3.5–5.1)
Sodium: 132 mEq/L — ABNORMAL LOW (ref 135–145)
Total Bilirubin: 0.5 mg/dL (ref 0.3–1.2)
Total Protein: 7.7 g/dL (ref 6.0–8.3)

## 2011-12-12 LAB — ACETAMINOPHEN LEVEL: Acetaminophen (Tylenol), Serum: <15 ug/mL (ref 10–30)

## 2011-12-12 MED ORDER — QUETIAPINE FUMARATE 300 MG PO TABS
400.0000 mg | ORAL_TABLET | Freq: Three times a day (TID) | ORAL | Status: DC
  Administered 2011-12-12: 400 mg via ORAL
  Filled 2011-12-12: qty 1

## 2011-12-12 MED ORDER — QUETIAPINE FUMARATE 400 MG PO TABS: 400.0000 mg | ORAL_TABLET | Freq: Every day | ORAL | Status: DC

## 2011-12-12 MED ORDER — FLUOXETINE HCL 20 MG PO CAPS: 20.0000 mg | ORAL_CAPSULE | Freq: Every day | ORAL | Status: DC

## 2011-12-12 MED ORDER — ZOLPIDEM TARTRATE 5 MG PO TABS: 5.0000 mg | ORAL_TABLET | Freq: Every evening | ORAL | Status: DC | PRN

## 2011-12-12 MED ORDER — FLUOXETINE HCL 20 MG PO CAPS
20.0000 mg | ORAL_CAPSULE | Freq: Every day | ORAL | Status: DC
  Administered 2011-12-12: 20 mg via ORAL
  Filled 2011-12-12 (×2): qty 1

## 2011-12-12 NOTE — ED Notes (Signed)
Dr knapp into see 
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
## 2011-12-12 NOTE — ED Notes (Signed)
PT does not have a pcp

## 2011-12-12 NOTE — ED Notes (Signed)
MD at bedside. 

## 2011-12-12 NOTE — ED Provider Notes (Signed)
Pt came in c/o SI by jumping off a balcony, which she denies to me now. Also reportedly states she was hearing voices and denies that now. States not taking any meds for a couple of days.  Only c/o headache. Will get telepysch. Has been referred to [facility] and [facility].   Ward Givens, MD 12/12/11 [id number]

## 2011-12-12 NOTE — ED Provider Notes (Signed)
Patient has had tele-psych consult done by Dr. Leretha Pol. She feels pt can be discharged home, pt has run out of her meds and requests I write her seoquel 400 mg qpm, prozac 20 mg qd.  Ward Givens, MD 12/12/11 1349

## 2011-12-14 IMAGING — CR DG CHEST 2V
2 series · 2 of 2 positions shown · non-contrast
Comparison: 10/30/2009

CLINICAL DATA: Overdose.

CHEST - 2 VIEW

[w chest lat]
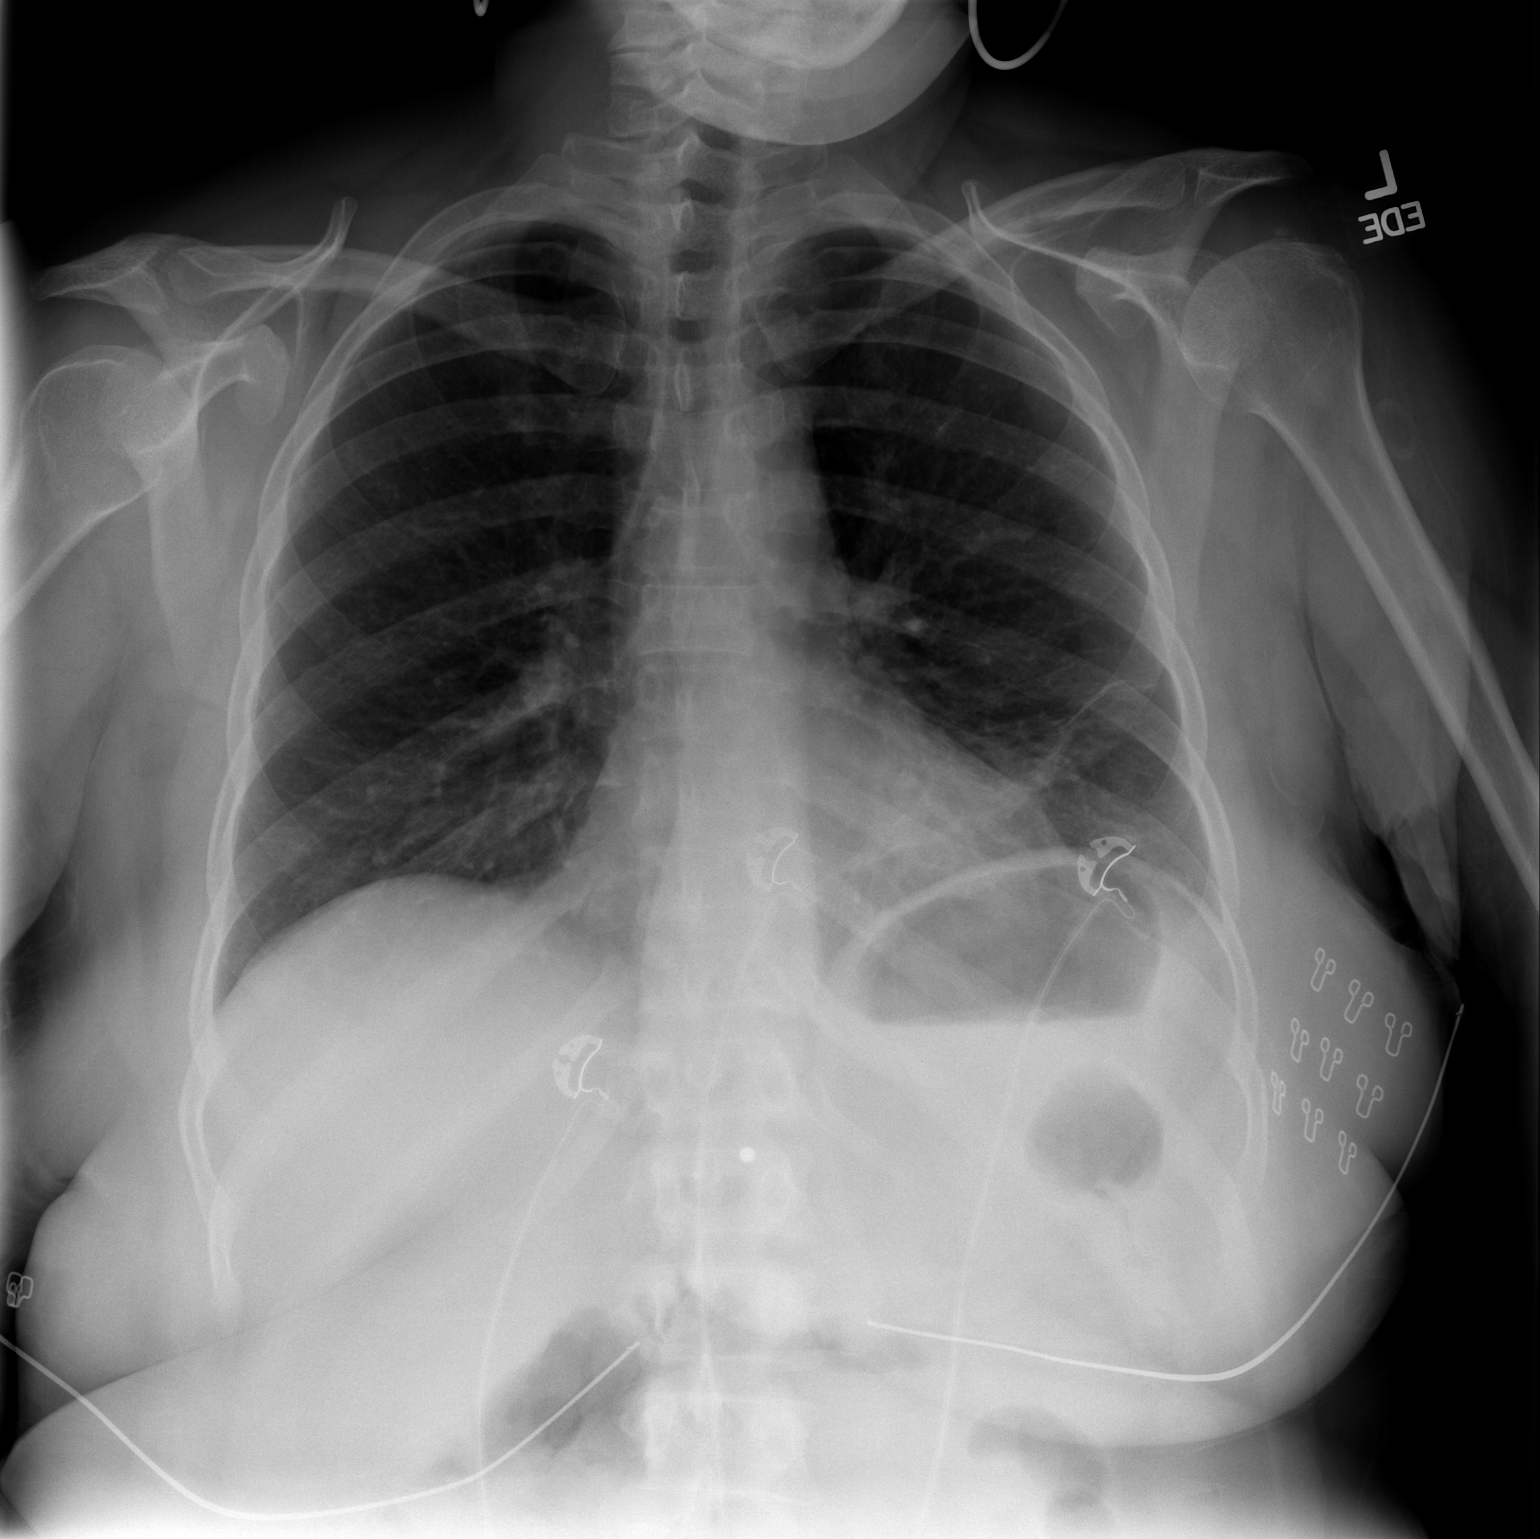

[w chest ap]
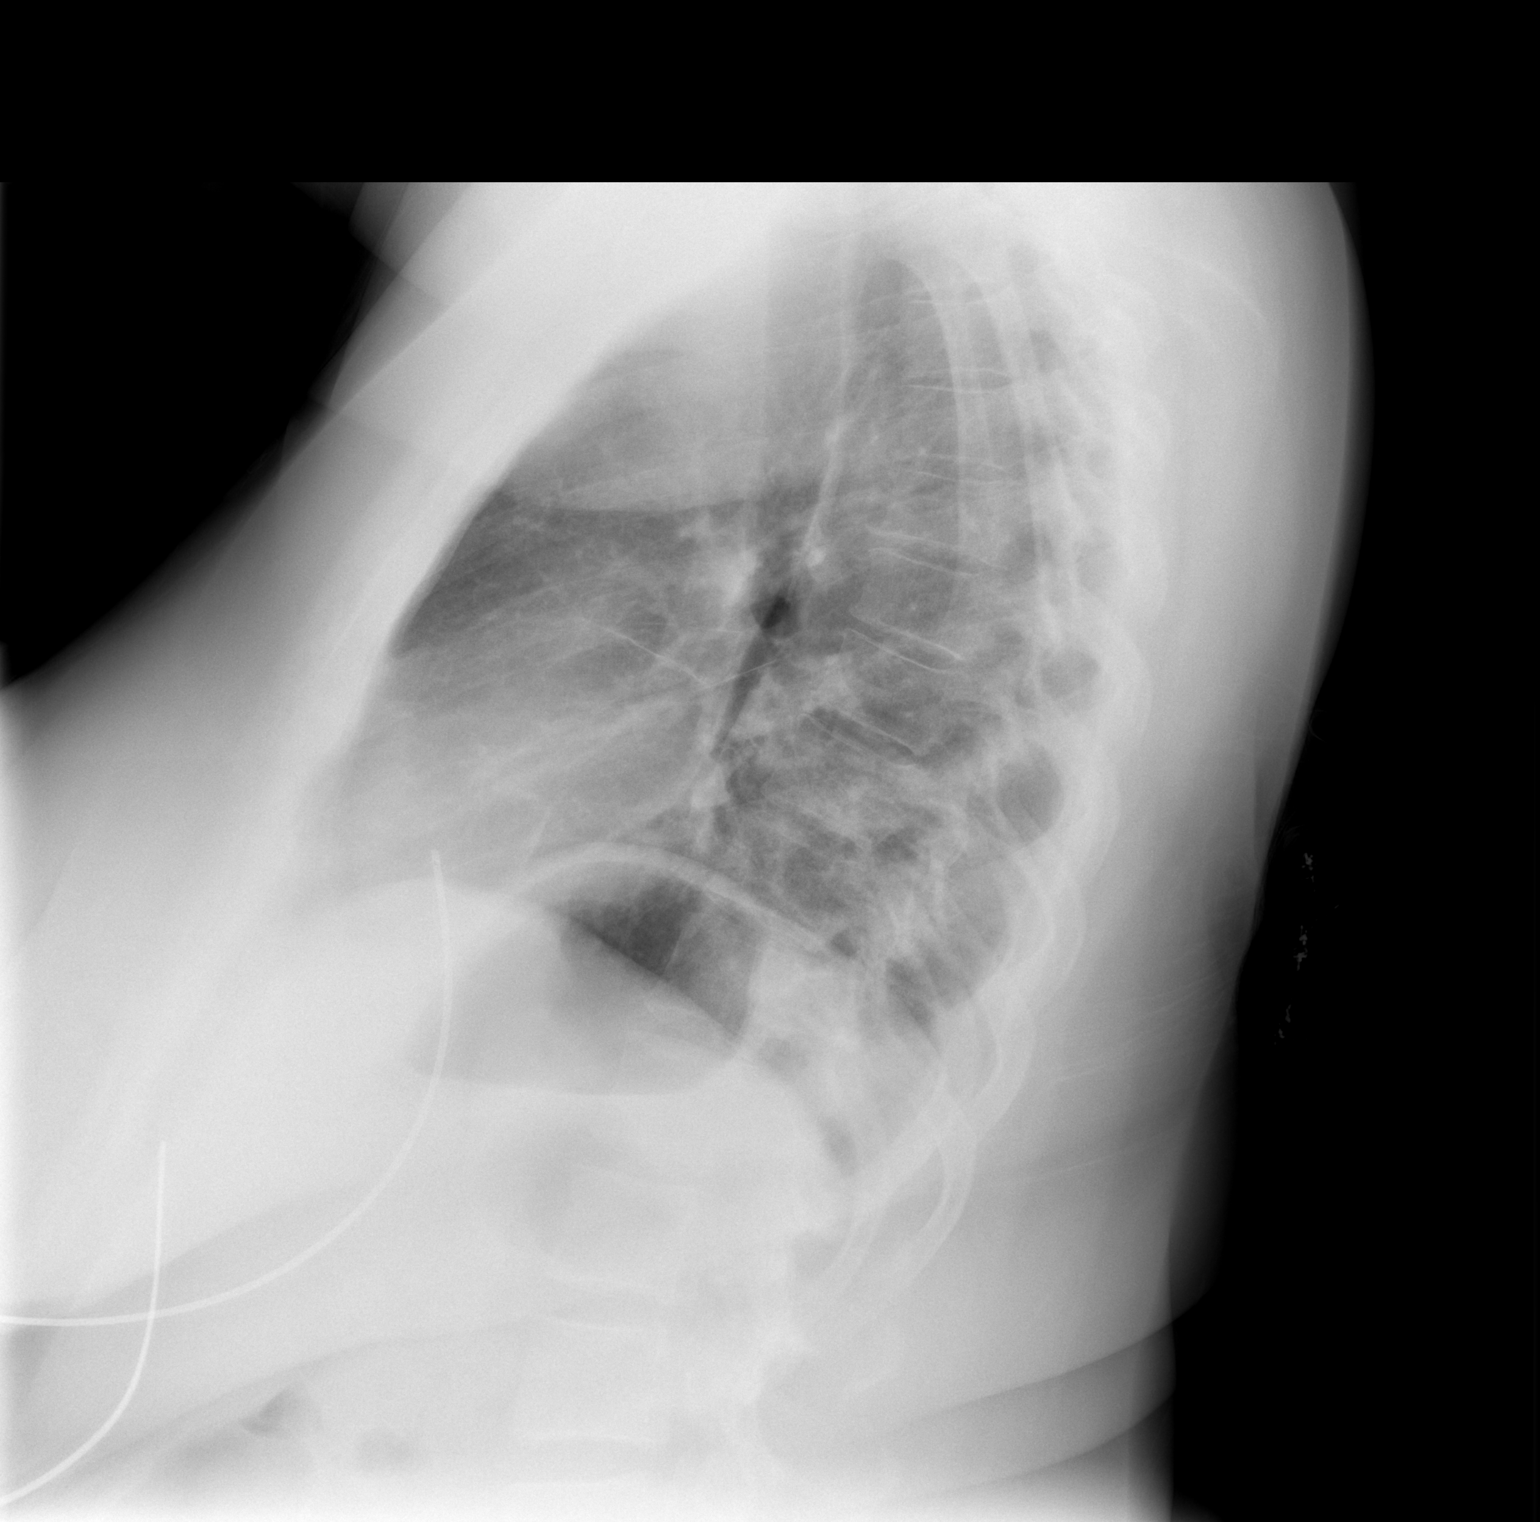

[2 of 2 positions shown; findings below may reference images not displayed]

FINDINGS: Trachea is midline.  Heart size normal.  Lungs are low in
volume with bibasilar atelectasis.  No pleural fluid.
IMPRESSION: Low lung volumes with bibasilar atelectasis.

## 2011-12-19 IMAGING — CR DG CHEST 1V PORT
1 series · 1 of 1 positions shown · non-contrast
Comparison: 11/28/2009.

CLINICAL DATA: Overdose.  Intubated.

PORTABLE CHEST - 1 VIEW

[series [date]]
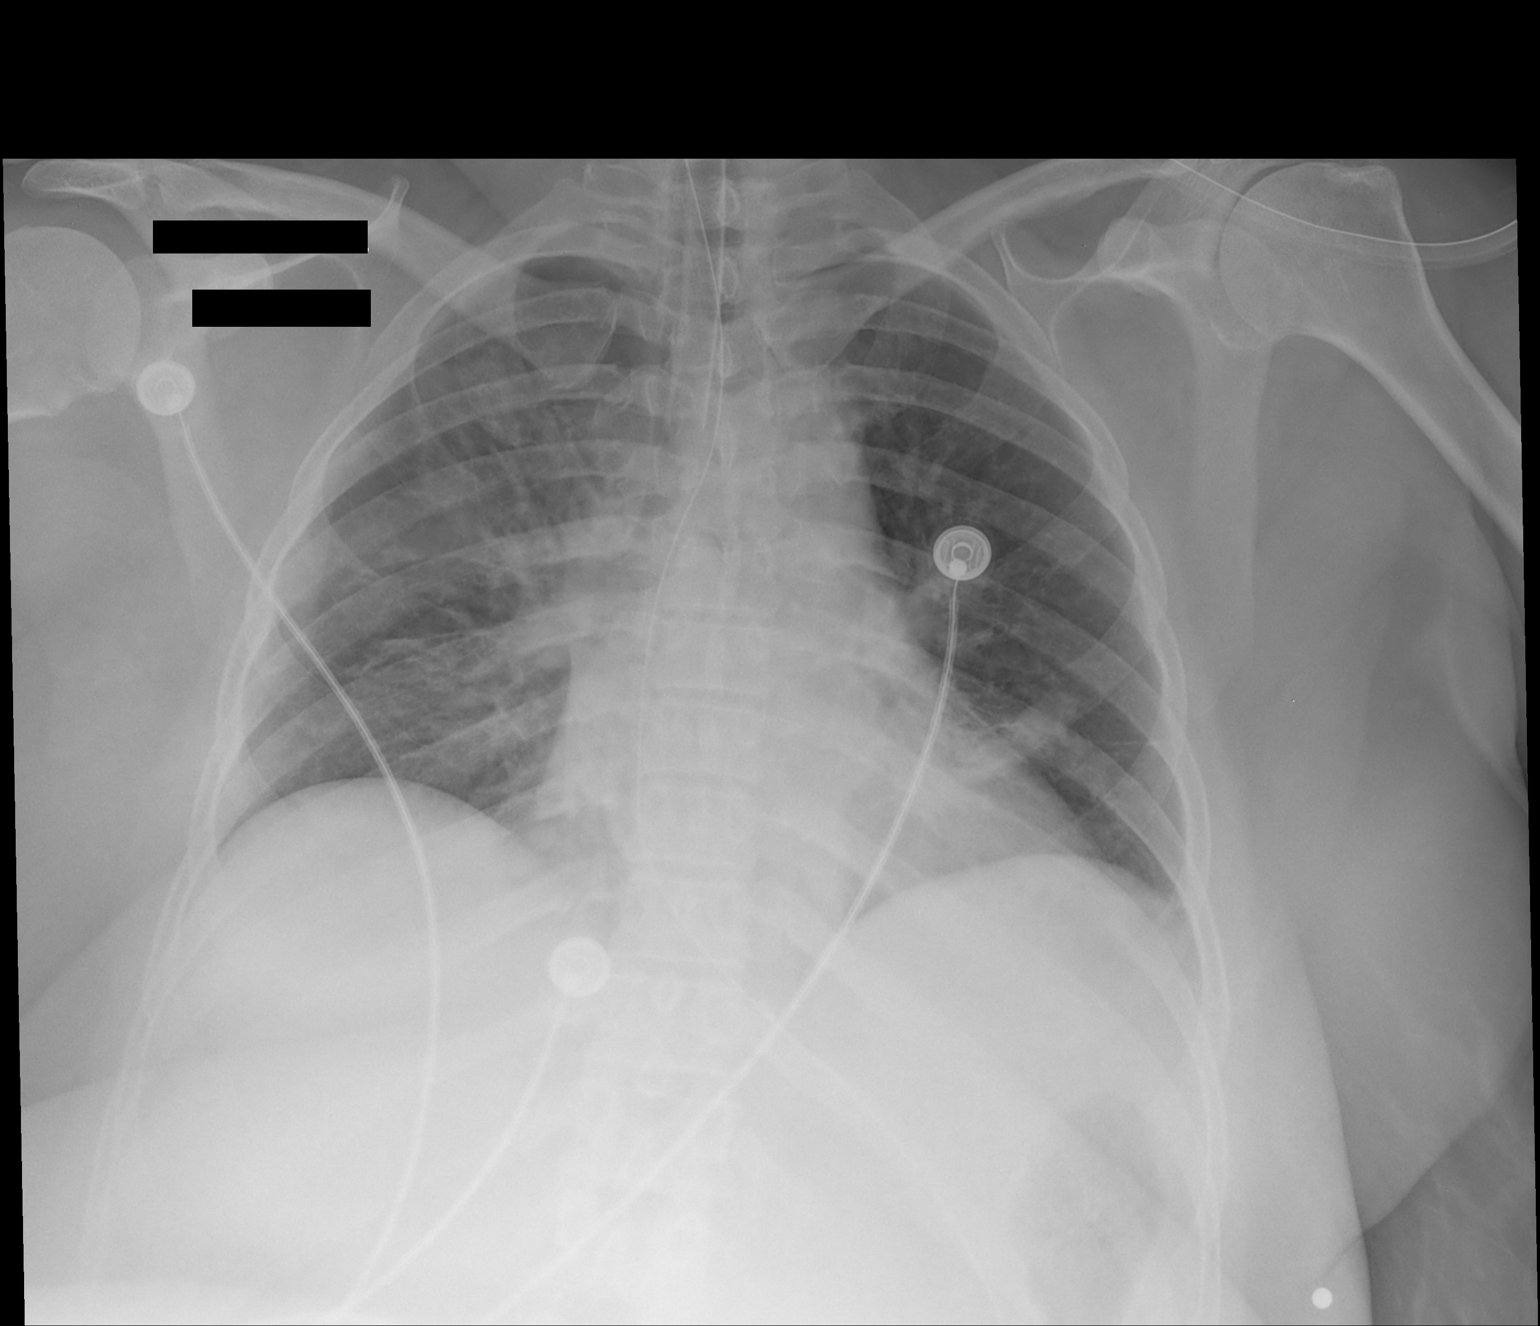

[1 of 1 positions shown; findings below may reference images not displayed]

FINDINGS: Endotracheal tube in satisfactory position.  Nasogastric
tube extending into the stomach.  Little change in amount of linear
density at the left lung base.  Interval ill-defined right
perihilar opacity.  Grossly normal sized heart.  Unremarkable
bones.
IMPRESSION: 1.  Interval mild right perihilar pneumonia or alveolar edema.
2.  Stable left basilar linear atelectasis.

## 2012-01-29 ENCOUNTER — Inpatient Hospital Stay (HOSPITAL_COMMUNITY)
Admission: AD | Admit: 2012-01-29 | Discharge: 2012-02-02 | DRG: 747 | Disposition: A | Discharge status: Home / Self Care | Payer: Medicaid Other | Source: Ambulatory Visit | Attending: Psychiatry | Admitting: Psychiatry

## 2012-01-29 ENCOUNTER — Encounter (HOSPITAL_COMMUNITY): Payer: Self-pay

## 2012-01-29 ENCOUNTER — Emergency Department (HOSPITAL_COMMUNITY)
Admission: EM | Admit: 2012-01-29 | Discharge: 2012-01-29 | Disposition: A | Discharge status: Other Acute Inpatient Hospital | Payer: Medicaid Other | Source: Home / Self Care | Attending: Emergency Medicine | Admitting: Emergency Medicine

## 2012-01-29 DIAGNOSIS — B029 Zoster without complications: Secondary | ICD-10-CM

## 2012-01-29 DIAGNOSIS — R45851 Suicidal ideations: Secondary | ICD-10-CM | POA: Insufficient documentation

## 2012-01-29 DIAGNOSIS — I10 Essential (primary) hypertension: Secondary | ICD-10-CM | POA: Insufficient documentation

## 2012-01-29 DIAGNOSIS — F259 Schizoaffective disorder, unspecified: Secondary | ICD-10-CM | POA: Insufficient documentation

## 2012-01-29 DIAGNOSIS — S335XXA Sprain of ligaments of lumbar spine, initial encounter: Secondary | ICD-10-CM

## 2012-01-29 DIAGNOSIS — R4182 Altered mental status, unspecified: Secondary | ICD-10-CM | POA: Insufficient documentation

## 2012-01-29 DIAGNOSIS — F17200 Nicotine dependence, unspecified, uncomplicated: Secondary | ICD-10-CM | POA: Insufficient documentation

## 2012-01-29 DIAGNOSIS — F319 Bipolar disorder, unspecified: Secondary | ICD-10-CM | POA: Diagnosis present

## 2012-01-29 DIAGNOSIS — Z87820 Personal history of traumatic brain injury: Secondary | ICD-10-CM | POA: Insufficient documentation

## 2012-01-29 DIAGNOSIS — M79609 Pain in unspecified limb: Secondary | ICD-10-CM

## 2012-01-29 DIAGNOSIS — F1410 Cocaine abuse, uncomplicated: Principal | ICD-10-CM | POA: Diagnosis present

## 2012-01-29 DIAGNOSIS — Z79899 Other long term (current) drug therapy: Secondary | ICD-10-CM | POA: Insufficient documentation

## 2012-01-29 DIAGNOSIS — N898 Other specified noninflammatory disorders of vagina: Secondary | ICD-10-CM

## 2012-01-29 DIAGNOSIS — F1910 Other psychoactive substance abuse, uncomplicated: Secondary | ICD-10-CM | POA: Insufficient documentation

## 2012-01-29 DIAGNOSIS — B20 Human immunodeficiency virus [HIV] disease: Secondary | ICD-10-CM

## 2012-01-29 DIAGNOSIS — IMO0002 Reserved for concepts with insufficient information to code with codable children: Secondary | ICD-10-CM

## 2012-01-29 DIAGNOSIS — X838XXA Intentional self-harm by other specified means, initial encounter: Secondary | ICD-10-CM

## 2012-01-29 DIAGNOSIS — R443 Hallucinations, unspecified: Secondary | ICD-10-CM | POA: Insufficient documentation

## 2012-01-29 DIAGNOSIS — N951 Menopausal and female climacteric states: Secondary | ICD-10-CM

## 2012-01-29 DIAGNOSIS — F489 Nonpsychotic mental disorder, unspecified: Secondary | ICD-10-CM | POA: Insufficient documentation

## 2012-01-29 DIAGNOSIS — F3189 Other bipolar disorder: Secondary | ICD-10-CM | POA: Diagnosis present

## 2012-01-29 DIAGNOSIS — E119 Type 2 diabetes mellitus without complications: Secondary | ICD-10-CM

## 2012-01-29 LAB — COMPREHENSIVE METABOLIC PANEL
Alkaline Phosphatase: 105 U/L (ref 39–117)
BUN: 9 mg/dL (ref 6–23)
Chloride: 101 mEq/L (ref 96–112)
Creatinine, Ser: 0.92 mg/dL (ref 0.50–1.10)
GFR calc Af Amer: 87 mL/min — ABNORMAL LOW (ref >90)
GFR calc non Af Amer: 75 mL/min — ABNORMAL LOW (ref >90)
Glucose, Bld: 148 mg/dL — ABNORMAL HIGH (ref 70–99)
Potassium: 4 mEq/L (ref 3.5–5.1)
Total Bilirubin: 0.1 mg/dL — ABNORMAL LOW (ref 0.3–1.2)

## 2012-01-29 LAB — CBC WITH DIFFERENTIAL/PLATELET
Basophils Relative: 0 % (ref 0–1)
Eosinophils Absolute: 0.2 10*3/uL (ref 0.0–0.7)
HCT: 38.7 % (ref 36.0–46.0)
Hemoglobin: 13.2 g/dL (ref 12.0–15.0)
Lymphs Abs: 3.1 10*3/uL (ref 0.7–4.0)
MCH: 27.8 pg (ref 26.0–34.0)
MCHC: 34.1 g/dL (ref 30.0–36.0)
Monocytes Absolute: 0.8 10*3/uL (ref 0.1–1.0)
Monocytes Relative: 10 % (ref 3–12)
Neutro Abs: 3.8 10*3/uL (ref 1.7–7.7)
Neutrophils Relative %: 48 % (ref 43–77)
RBC: 4.74 MIL/uL (ref 3.87–5.11)

## 2012-01-29 LAB — RAPID URINE DRUG SCREEN, HOSP PERFORMED: Opiates: NOT DETECTED

## 2012-01-29 LAB — ETHANOL: Alcohol, Ethyl (B): <11 mg/dL (ref 0–11)

## 2012-01-29 MED ORDER — TRAZODONE HCL 50 MG PO TABS
50.0000 mg | ORAL_TABLET | Freq: Every evening | ORAL | Status: DC | PRN
  Administered 2012-01-29 – 2012-02-01 (×4): 50 mg via ORAL
  Filled 2012-01-29 (×2): qty 1
  Filled 2012-01-29: qty 6
  Filled 2012-01-29 (×2): qty 1

## 2012-01-29 MED ORDER — FLUOXETINE HCL 20 MG PO CAPS
20.0000 mg | ORAL_CAPSULE | Freq: Every day | ORAL | Status: DC
  Administered 2012-01-29: 20 mg via ORAL
  Filled 2012-01-29: qty 1

## 2012-01-29 MED ORDER — QUETIAPINE FUMARATE 200 MG PO TABS
400.0000 mg | ORAL_TABLET | Freq: Three times a day (TID) | ORAL | Status: DC
  Administered 2012-01-29: 400 mg via ORAL
  Filled 2012-01-29 (×2): qty 2

## 2012-01-29 MED ORDER — ACETAMINOPHEN 325 MG PO TABS: 650.0000 mg | ORAL_TABLET | ORAL | Status: DC | PRN

## 2012-01-29 MED ORDER — ZOLPIDEM TARTRATE 5 MG PO TABS: 10.0000 mg | ORAL_TABLET | Freq: Every evening | ORAL | Status: DC | PRN

## 2012-01-29 MED ORDER — ACETAMINOPHEN 325 MG PO TABS
650.0000 mg | ORAL_TABLET | Freq: Four times a day (QID) | ORAL | Status: DC | PRN
  Administered 2012-01-31 – 2012-02-01 (×2): 650 mg via ORAL

## 2012-01-29 MED ORDER — ALUM & MAG HYDROXIDE-SIMETH 200-200-20 MG/5ML PO SUSP: 30.0000 mL | ORAL | Status: DC | PRN

## 2012-01-29 MED ORDER — MAGNESIUM HYDROXIDE 400 MG/5ML PO SUSP: 30.0000 mL | Freq: Every day | ORAL | Status: DC | PRN

## 2012-01-29 NOTE — [facility] Counselor (Signed)
[name], assessment counselor at [facility], submitted Pt for admission to [facility]. Consulted with Rosey Bath, [facility] who confirmed bed availability. Gave clinical report to Armandina Stammer, NP who accepted Pt to the service of Dr. Cyndia Diver, room 405-2.  Harlin Rain Patsy Baltimore, LPC, NCC

## 2012-01-29 NOTE — ED Notes (Signed)
Attempted to give pt medication; pt is asleep with sitter at bedside; will attempt to give pt medication when awake;

## 2012-01-29 NOTE — ED Notes (Signed)
Pt given sprite per request 

## 2012-01-29 NOTE — ED Notes (Signed)
Pt brought back to Pod C; pt calm and cooperative during assessment; pt states she is depressed and suicidal; states she has been suicidal in the past by overdosing on medication that caused her to lose her left arm; pt states she has a plan "i want to walk in front of a car"; pt also has a hx of EtOH; states she has been sober for 6 months and has no intentions of drinking;

## 2012-01-29 NOTE — ED Notes (Signed)
Regular Diet Ordered Spoke with [employer]

## 2012-01-29 NOTE — ED Notes (Signed)
Pt brought over to Pod C/CDU; pt given blankets by RN and is now resting on stretcher

## 2012-01-29 NOTE — Tx Team (Signed)
Initial Interdisciplinary Treatment Plan  PATIENT STRENGTHS: (choose at least two) Average or above average intelligence Capable of independent living Motivation for treatment/growth Supportive family/friends  PATIENT STRESSORS: Financial difficulties   PROBLEM LIST: Problem List/Patient Goals Date to be addressed Date deferred Reason deferred Estimated date of resolution  Suicide  01/29/12     depression 01/29/12     anxiety 01/29/12                                          DISCHARGE CRITERIA:  Ability to meet basic life and health needs Improved stabilization in mood, thinking, and/or behavior Medical problems require only outpatient monitoring Motivation to continue treatment in a less acute level of care Verbal commitment to aftercare and medication compliance  PRELIMINARY DISCHARGE PLAN: Attend aftercare/continuing care group Attend PHP/IOP Outpatient therapy Return to previous living arrangement  PATIENT/FAMIILY INVOLVEMENT: This treatment plan has been presented to and reviewed with the patient, [name], and/or family member,   The patient and family have been given the opportunity to ask questions and make suggestions.  [name], [name] 01/29/2012, 11:15 PM

## 2012-01-29 NOTE — Progress Notes (Signed)
Patient ID: [name], female   DOB: [birth year], 47 y.o.   MRN: [id number] Admission note: D:Patient is a  Voluntary admission in no acute distress for suicidal ideation, depression, and anxiety. Pt stated increased depression for past couple of months. Pt refused to elaborate on cause of depression. Pt stated she is compliant with her medications at home. Pt endorses a SI/AVH with command to hurt self. Pt stated she "sees people telling her to hurt herself by running into traffic". Pt denies HI.  A: Pt admitted to unit per protocol, skin assessment and search done.  Pt educated on therapeutic milieu rules. Pt was introduced to milieu by nursing staff. Pt offered food and water. Medication administered for sleep.   R: Pt was receptive to education. Pt contracted for safety, 15 min safety checks started. Clinical research associate offered support
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
## 2012-01-29 NOTE — ED Notes (Signed)
rn-lori advised of elevated heart rate.
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
## 2012-01-29 NOTE — ED Provider Notes (Signed)
History     CSN: [id number]  Arrival date & time 01/29/12  1127   First MD Initiated Contact with Patient 01/29/12 1202      Chief Complaint  Patient presents with  . Suicide Attempt    (Consider location/radiation/quality/duration/timing/severity/associated sxs/prior treatment) Patient is a 47 y.o. female presenting with altered mental status. The history is provided by the patient (the pt states she is hearing voicies and wants to kill herself). No language interpreter was used.  Altered Mental Status This is a new problem. The current episode started more than 2 days ago. The problem occurs constantly. The problem has not changed since onset.Pertinent negatives include no chest pain, no abdominal pain and no headaches. Nothing aggravates the symptoms. Nothing relieves the symptoms.    Past Medical History  Diagnosis Date  . Bipolar disorder   . Schizoaffective disorder   . Suicide and self-inflicted injury   . Hypertension   . Traumatic brain injury   . Substance abuse     Past Surgical History  Procedure Date  . 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 Bowel obstruction   . Surgery r/t bowel obstruction     No family history on file.  History  Substance Use Topics  . Smoking status: Current Every Day Smoker -- 0.5 packs/day for 30 years    Types: Cigarettes  . Smokeless tobacco: Never Used  . Alcohol Use: 3.5 oz/week    7 drink(s) per week    OB History    Grav Para Term Preterm Abortions TAB SAB Ect Mult Living                  Review of Systems  Constitutional: Negative for fatigue.  HENT: Negative for congestion, sinus pressure and ear discharge.   Eyes: Negative for discharge.  Respiratory: Negative for cough.   Cardiovascular: Negative for chest pain.  Gastrointestinal: Negative for abdominal pain and diarrhea.  Genitourinary: Negative for frequency and hematuria.  Musculoskeletal: Negative for back pain.  Skin: Negative for rash.  Neurological: Negative for  seizures and headaches.  Hematological: Negative.   Psychiatric/Behavioral: Positive for hallucinations and altered mental status.       Suicidal    Allergies  Lithium; Risperidone and related; and Thorazine  Home Medications   Current Outpatient Rx  Name  Route  Sig  Dispense  Refill  . FLUOXETINE HCL 20 MG PO CAPS   Oral   Take 1 capsule (20 mg total) by mouth daily.   30 capsule   0   . QUETIAPINE FUMARATE 400 MG PO TABS   Oral   Take 400 mg by mouth 3 (three) times daily.         Marland Kitchen ZOLPIDEM TARTRATE 10 MG PO TABS   Oral   Take 10 mg by mouth at bedtime as needed. For sleep           BP 131/87  Pulse 130  Temp 97.9 F (36.6 C) (Oral)  Resp 20  SpO2 96%  LMP 01/22/2012  Physical Exam  Constitutional: She is oriented to person, place, and time. She appears well-developed.  HENT:  Head: Normocephalic and atraumatic.  Eyes: Conjunctivae normal and EOM are normal. No scleral icterus.  Neck: Neck supple. No thyromegaly present.  Cardiovascular: Normal rate and regular rhythm.  Exam reveals no gallop and no friction rub.   No murmur heard. Pulmonary/Chest: No stridor. She has no wheezes. She has no rales. She exhibits no tenderness.  Abdominal: She exhibits  no distension. There is no tenderness. There is no rebound.  Musculoskeletal: Normal range of motion. She exhibits no edema.       Amputation left upper extremity  Lymphadenopathy:    She has no cervical adenopathy.  Neurological: She is oriented to person, place, and time. Coordination normal.  Skin: No rash noted. No erythema.  Psychiatric:       Suicidal and auditory hallucinations    ED Course  Procedures (including critical care time)   Labs Reviewed  CBC WITH DIFFERENTIAL  COMPREHENSIVE METABOLIC PANEL  ETHANOL  URINE RAPID DRUG SCREEN (HOSP PERFORMED)   No results found.   No diagnosis found.    MDM          Benny Lennert, MD 01/29/12 [phone]

## 2012-01-30 DIAGNOSIS — F319 Bipolar disorder, unspecified: Secondary | ICD-10-CM

## 2012-01-30 DIAGNOSIS — F259 Schizoaffective disorder, unspecified: Secondary | ICD-10-CM

## 2012-01-30 DIAGNOSIS — F1910 Other psychoactive substance abuse, uncomplicated: Secondary | ICD-10-CM

## 2012-01-30 DIAGNOSIS — F4310 Post-traumatic stress disorder, unspecified: Secondary | ICD-10-CM

## 2012-01-30 MED ORDER — QUETIAPINE FUMARATE 200 MG PO TABS
200.0000 mg | ORAL_TABLET | Freq: Every evening | ORAL | Status: DC | PRN
  Administered 2012-01-30 – 2012-02-01 (×3): 200 mg via ORAL
  Filled 2012-01-30 (×3): qty 1

## 2012-01-30 MED ORDER — ONDANSETRON 4 MG PO TBDP
4.0000 mg | ORAL_TABLET | Freq: Three times a day (TID) | ORAL | Status: DC | PRN
  Administered 2012-01-30: 4 mg via ORAL
  Filled 2012-01-30: qty 1

## 2012-01-30 MED ORDER — FLUOXETINE HCL 20 MG PO CAPS
20.0000 mg | ORAL_CAPSULE | Freq: Every day | ORAL | Status: DC
  Administered 2012-01-30: 20 mg via ORAL
  Filled 2012-01-30 (×2): qty 1

## 2012-01-30 MED ORDER — QUETIAPINE FUMARATE 100 MG PO TABS
100.0000 mg | ORAL_TABLET | Freq: Four times a day (QID) | ORAL | Status: DC
  Administered 2012-01-30 – 2012-02-02 (×12): 100 mg via ORAL
  Filled 2012-01-30 (×17): qty 1

## 2012-01-30 NOTE — Progress Notes (Signed)
Psychoeducational Group Note  Date:  01/30/2012 Time:  0945 am  Group Topic/Focus:  Identifying Needs:   The focus of this group is to help patients identify their personal needs that have been historically problematic and identify healthy behaviors to address their needs.  Participation Level:  Active  Participation Quality:  Attentive  Affect:  Anxious  Cognitive:  Alert  Insight:  Limited  Engagement in Group:  Improving  Additional Comments:    Andrena Mews 01/30/2012,10:35 AM

## 2012-01-30 NOTE — Progress Notes (Signed)
01/30/12 11:18 AM NSG shift assessment. 7a-7p. D: Affect blunted, mood anxious, behavior guarded. Appearance is well kempt today. Attended group, but participation was minimal. Stated that she is feeling very anxious and she did appear anxious. Her medications were ordered late because of her late admission here and she was concerned about not getting her am medications that help her anxiety. Attended counselor's session. A: Gave pt her medications after they were ordered. The order for Seroquel was put in to start at noon, and per patient request I gave it about 1 hour early for her comfort. Observed her interacting in the milieu: Support and encouragement offered. R: Safety maintained on the unit with q15 minute observations and plan of care being followed. 2:05 PM D: Heard pt vomiting in her bathroom. She said that it is her anxiety causing her to vomit and she said that she has had nausea since this morning. A: Spoke with PA and obtained order for Zofran ODT 4mg  and administered it. R: Medication helped.

## 2012-01-30 NOTE — [facility] Suicide Risk Assessment (Signed)
Suicide Risk Assessment  Admission Assessment     Nursing information obtained from:  Patient Demographic factors:  Low socioeconomic status;Unemployed Current Mental Status:  Suicidal ideation indicated by patient;Self-harm thoughts;Self-harm behaviors;Intention to act on suicide plan Patient reports some suicidal ideation and plans as well as mumbling voices and paranoia without any homicidal ideation Patient engages with fair eye contact, is able to focus adequately in a one to one setting, and has clear goal directed thoughts. Patient speaks with a natural conversational volume, rate, and tone. Anxiety was reported at 10 on a scale of 1 the least and 10 the most. Depression was reported at 10 on the same scale. Patient is oriented times 4, recent and remote memory intact. Judgement: impaired by her mental illness Insight: also impaired by her mental illness   Loss Factors:  Financial problems / change in socioeconomic status Historical Factors:  Prior suicide attempts Risk Reduction Factors:  Living with another person, especially a relative;Positive social support  CLINICAL FACTORS:   Severe Anxiety and/or Agitation Bipolar Disorder:   Bipolar I Alcohol/Substance Abuse/Dependencies Previous Psychiatric Diagnoses and Treatments  COGNITIVE FEATURES THAT CONTRIBUTE TO RISK:  Thought constriction (tunnel vision)    SUICIDE RISK:   Moderate:  Frequent suicidal ideation with limited intensity, and duration, some specificity in terms of plans, no associated intent, good self-control, limited dysphoria/symptomatology, some risk factors present, and identifiable protective factors, including available and accessible social support.  PLAN OF CARE: Restart meds  I certify inpatient care is indicated.  Kaarin Pardy 01/30/2012, 9:58 AM
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
## 2012-01-30 NOTE — Progress Notes (Signed)
Psychoeducational Group Note  Date:  01/30/2012 Time:  1515  Group Topic/Focus:  Self Care:   The focus of this group is to help patients understand the importance of self-care in order to improve or restore emotional, physical, spiritual, interpersonal, and financial health.  Participation Level:  Active  Participation Quality:  Appropriate and Attentive  Affect:  Appropriate  Cognitive:  Appropriate  Insight:  Engaged  Engagement in Group:  Engaged  Additional Comments:  Pt participated and processed in group.  Marquis Lunch, Jairen Goldfarb 01/30/2012, 7:21 PM

## 2012-01-31 MED ORDER — FLUOXETINE HCL 20 MG PO CAPS
20.0000 mg | ORAL_CAPSULE | Freq: Every day | ORAL | Status: DC
  Administered 2012-01-31 – 2012-02-02 (×3): 20 mg via ORAL
  Filled 2012-01-31 (×4): qty 1

## 2012-01-31 NOTE — Progress Notes (Signed)
Psychoeducational Group Note  Date:  01/31/2012 Time:  0945 am  Group Topic/Focus:  Making Healthy Choices:   The focus of this group is to help patients identify negative/unhealthy choices they were using prior to admission and identify positive/healthier coping strategies to replace them upon discharge.  Participation Level:  Minimal  Participation Quality:  Inattentive  Affect:  Blunted  Cognitive:  Alert  Insight:  Lacking  Engagement in Group:  Lacking  Additional Comments:   Andrena Mews 01/31/2012, 10:29 AM

## 2012-01-31 NOTE — H&P (Addendum)
Psychiatric Admission Assessment Adult  Patient Identification:  [name] Date of Evaluation:  01/31/2012 Chief Complaint:  PTSD and Schizoaffective Disorder and Substance Abuse History of Present Illness:: Pt had been in this hospital 5 years ago.  She was placed on Prozac and Seroquel with goo success.  She stopped taking them and now if most depressed.  She deneis any manic episodes at this time.  Elements:  Timing:  recently. Associated Signs/Synptoms: Depression Symptoms:  depressed mood, insomnia, psychomotor agitation, feelings of worthlessness/guilt, difficulty concentrating, suicidal thoughts without plan, (Hypo) Manic Symptoms:  none Anxiety Symptoms:  none Psychotic Symptoms:  none PTSD Symptoms: Had a traumatic exposure:  in the past Re-experiencing:  Intrusive Thoughts Hypervigilance:  Yes Hyperarousal:  Increased Startle Response  Psychiatric Specialty Exam: Physical Exam  Constitutional: She appears well-developed and well-nourished.  HENT:  Head: Normocephalic and atraumatic.    Review of Systems  Constitutional: Negative.   HENT: Negative.   Eyes: Negative.   Respiratory: Negative.   Cardiovascular: Negative.   Gastrointestinal: Negative.   Genitourinary: Negative.   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   Neurological: Negative.   Endo/Heme/Allergies: Negative.   Psychiatric/Behavioral: Positive for depression and suicidal ideas.    Blood pressure 123/77, pulse 91, temperature 98.3 F (36.8 C), temperature source Oral, resp. rate 18, height 5' 3.39" (1.61 m), weight 107.049 kg (236 lb), last menstrual period 01/22/2012.Body mass index is 41.30 kg/(m^2).  General Appearance: Casual  Eye Contact::  Good  Speech:  Clear and Coherent  Volume:  Normal  Mood:  Anxious and Depressed  Affect:  Congruent  Thought Process:  Coherent  Orientation:  Full (Time, Place, and Person)  Thought Content:  WDL  Suicidal  Thoughts:  Yes.  without intent/plan  Homicidal Thoughts:  No  Memory:  Immediate;   Fair Recent;   Fair Remote;   Fair  Judgement:  Fair  Insight:  Fair  Psychomotor Activity:  Normal  Concentration:  Good  Recall:  Fair  Akathisia:  No  Handed:  Left  AIMS (if indicated):     Assets:  Communication Skills Desire for Improvement  Sleep:  Number of Hours: 5.75     Past Psychiatric History: Diagnosis:  Hospitalizations:  Outpatient Care:  Substance Abuse Care:  Self-Mutilation:  Suicidal Attempts:  Violent Behaviors:   Past Medical History:   Past Medical History  Diagnosis Date  . Bipolar disorder   . Schizoaffective disorder   . Suicide and self-inflicted injury   . Hypertension   . Traumatic brain injury   . Substance abuse    None. Allergies:   Allergies  Allergen Reactions  . Lithium Other (See Comments)    Severe muscle reaction  . Risperidone And Related Other (See Comments)    Muscle spasms  . Thorazine (Chlorpromazine) Other (See Comments)    Severe muscle reaction   PTA Medications: Prescriptions prior to admission  Medication Sig Dispense Refill  . FLUoxetine (PROZAC) 20 MG capsule Take 1 capsule (20 mg total) by mouth daily.  30 capsule  0  . QUEtiapine (SEROQUEL) 400 MG tablet Take 400 mg by mouth 3 (three) times daily.      Marland Kitchen zolpidem (AMBIEN) 10 MG tablet Take 10 mg by mouth at bedtime as needed. For sleep        Previous Psychotropic Medications:  Medication/Dose  Substance Abuse History in the last 12 months:  no  Consequences of Substance Abuse: Negative  Social History:  reports that she has been smoking Cigarettes.  She has a 15 pack-year smoking history. She has never used smokeless tobacco. She reports that she drinks about 3.5 ounces of alcohol per week. She reports that she uses illicit drugs (Cocaine and Marijuana). Additional Social History:                      Current Place of Residence:     Place of Birth:   Family Members: Marital Status:   Children:  Sons:  Daughters: Relationships: Education:   Educational Problems/Performance: Religious Beliefs/Practices: History of Abuse (Emotional/Phsycial/Sexual) Teacher, music History:   Legal History: Hobbies/Interests:  Family History:  History reviewed. No pertinent family history.  Results for orders placed during the hospital encounter of 01/29/12 (from the past 72 hour(s))  URINE RAPID DRUG SCREEN (HOSP PERFORMED)     Status: Abnormal   Collection Time   01/29/12  1:42 PM      Component Value Range Comment   Opiates NONE DETECTED  NONE DETECTED    Cocaine POSITIVE (*) NONE DETECTED    Benzodiazepines NONE DETECTED  NONE DETECTED    Amphetamines NONE DETECTED  NONE DETECTED    Tetrahydrocannabinol NONE DETECTED  NONE DETECTED    Barbiturates NONE DETECTED  NONE DETECTED   CBC WITH DIFFERENTIAL     Status: Abnormal   Collection Time   01/29/12  2:04 PM      Component Value Range Comment   WBC 8.0  4.0 - 10.5 K/uL    RBC 4.74  3.87 - 5.11 MIL/uL    Hemoglobin 13.2  12.0 - 15.0 g/dL    HCT 47.8  29.5 - 62.1 %    MCV 81.6  78.0 - 100.0 fL    MCH 27.8  26.0 - 34.0 pg    MCHC 34.1  30.0 - 36.0 g/dL    RDW 30.8 (*) 65.7 - 15.5 %    Platelets 314  150 - 400 K/uL    Neutrophils Relative 48  43 - 77 %    Neutro Abs 3.8  1.7 - 7.7 K/uL    Lymphocytes Relative 39  12 - 46 %    Lymphs Abs 3.1  0.7 - 4.0 K/uL    Monocytes Relative 10  3 - 12 %    Monocytes Absolute 0.8  0.1 - 1.0 K/uL    Eosinophils Relative 3  0 - 5 %    Eosinophils Absolute 0.2  0.0 - 0.7 K/uL    Basophils Relative 0  0 - 1 %    Basophils Absolute 0.0  0.0 - 0.1 K/uL   COMPREHENSIVE METABOLIC PANEL     Status: Abnormal   Collection Time   01/29/12  2:04 PM      Component Value Range Comment   Sodium 141  135 - 145 mEq/L    Potassium 4.0  3.5 - 5.1 mEq/L    Chloride 101  96 - 112 mEq/L    CO2 23  19 - 32 mEq/L    Glucose, Bld  148 (*) 70 - 99 mg/dL    BUN 9  6 - 23 mg/dL    Creatinine, Ser 8.46  0.50 - 1.10 mg/dL    Calcium 9.1  8.4 - 96.2 mg/dL    Total Protein 7.5  6.0 - 8.3 g/dL    Albumin 3.6  3.5 - 5.2 g/dL    AST 24  0 - 37 U/L    ALT 23  0 - 35 U/L    Alkaline Phosphatase 105  39 - 117 U/L    Total Bilirubin 0.1 (*) 0.3 - 1.2 mg/dL    GFR calc non Af Amer 75 (*) >90 mL/min    GFR calc Af Amer 87 (*) >90 mL/min   ETHANOL     Status: Normal   Collection Time   01/29/12  2:04 PM      Component Value Range Comment   Alcohol, Ethyl (B) <11  0 - 11 mg/dL    Psychological Evaluations:  Assessment:   AXIS I:  Post Traumatic Stress Disorder, Schizoaffective Disorder and Substance Abuse AXIS II:  Deferred AXIS III:   Past Medical History  Diagnosis Date  . Bipolar disorder   . Schizoaffective disorder   . Suicide and self-inflicted injury   . Hypertension   . Traumatic brain injury   . Substance abuse    AXIS IV:  other psychosocial or environmental problems AXIS V:  21-30 behavior considerably influenced by delusions or hallucinations OR serious impairment in judgment, communication OR inability to function in almost all areas  Treatment Plan/Recommendations:  Admit restart meds and adjust as indicated  Treatment Plan Summary: Daily contact with patient to assess and evaluate symptoms and progress in treatment Medication management Current Medications:  Current Facility-Administered Medications  Medication Dose Route Frequency Provider Last Rate Last Dose  . acetaminophen (TYLENOL) tablet 650 mg  650 mg Oral Q6H PRN Sanjuana Kava, NP      . alum & mag hydroxide-simeth (MAALOX/MYLANTA) 200-200-20 MG/5ML suspension 30 mL  30 mL Oral Q4H PRN Sanjuana Kava, NP      . FLUoxetine (PROZAC) capsule 20 mg  20 mg Oral Daily Mike Craze, MD      . magnesium hydroxide (MILK OF MAGNESIA) suspension 30 mL  30 mL Oral Daily PRN Sanjuana Kava, NP      . ondansetron (ZOFRAN-ODT) disintegrating tablet 4 mg  4  mg Oral Q8H PRN Nanine Means, NP   4 mg at 01/30/12 1345  . QUEtiapine (SEROQUEL) tablet 100 mg  100 mg Oral QID Mike Craze, MD   100 mg at 01/31/12 0834  . QUEtiapine (SEROQUEL) tablet 200 mg  200 mg Oral QHS PRN Mike Craze, MD   200 mg at 01/30/12 2056  . traZODone (DESYREL) tablet 50 mg  50 mg Oral QHS PRN,MR X 1 Sanjuana Kava, NP   50 mg at 01/30/12 2056    Observation Level/Precautions:  routine  Laboratory:    Psychotherapy:    Medications:  Prozac and Seroquel, change Prozac to AM dosing per pt request.  Consultations:    Discharge Concerns:    Estimated LOS:  Other:     I certify that inpatient services furnished can reasonably be expected to improve the patient's condition.   [place], Tyjae Issa 1/5/20149:03 AM

## 2012-01-31 NOTE — Progress Notes (Signed)
Psychoeducational Group Note  Date:  01/31/2012 Time:  2000  Group Topic/Focus:  Wrap-Up Group:   The focus of this group is to help patients review their daily goal of treatment and discuss progress on daily workbooks.  Participation Level:  Active  Participation Quality:  Appropriate  Affect:  Appropriate  Cognitive:  Appropriate  Insight:  Engaged  Engagement in Group:  Engaged  Additional Comments:  Patient attended and participated in group tonight. Patient reports that today she felt depress, however, after talking to staff she felt better. She went to the gym today, socialized with peers, attended groups and take her medications. Patient advised that her support system was [facility].  Lita Mains [facility] 01/31/2012, 9:57 PM

## 2012-01-31 NOTE — Clinical Social Work Psychosocial (Signed)
[facility] Group Notes:  (Clinical Social Work)  01/31/2012   11:15-11:45AM  Summary of Progress/Problems:  The main focus of today's process group was to listen to a variety of genres of music and to identify that different types of music provoke different responses.  The patient then was able to identify personally what was soothing for them, as well as energizing.  Examples were given of how to use this knowledge in sleep habits, with depression, and with other symptoms.  The patient expressed good understanding of the usefulness of music in mood control and enjoyment of the  actual music played.  Type of Therapy:  Music Therapy with processing done  Participation Level:  Active  Participation Quality:  Attentive  Affect:  Blunted  Cognitive:  Oriented  Insight:  Engaged  Engagement in Therapy:  Engaged  Modes of Intervention:   Socialization, Support and Processing, Exploration, Education, [place]   [employer], LCSW 01/31/2012, 12:30 PM

## 2012-01-31 NOTE — Progress Notes (Signed)
Patient ID: [name], female   DOB: 04-04-1965, 47 y.o.   MRN: [id number] D)  Was sleeping when group was taking place tonight, woke up and came out to the med window shortly after, looking disheveled, but did take her meds and got a snack, missed the group however.  Stated was tired but feeling better.  Watched tv for a few minutes and went back to her room and back to bed, was pleasant and cooperative. A)  Will continue to monitor for safety q 15 minutes, offer support and encouraged to go to groups. R)  Receptive, safety maintained.

## 2012-01-31 NOTE — Progress Notes (Signed)
D: Patient's self inventory sheet, patient has fair sleep, poor appetite, low energy level, good attention span.  Rated depression #5, denied hopelessness, anxiety #5.  Denied withdrawals.   Denied SI.  Denied physical problems.  Denied pain.  No questions for staff.  After discharge, plans to go to "home place".  Has medicaid to purchase her medications. A:  Medications administered per MD order.  Support and encouragement given throughout day. Support and safety checks completed as ordered. R:  Following treatment plan.  Denied SI and HI.   Denied A/V hallucinations.  Contracts for safety.  Patient remains safe on unit.

## 2012-02-01 DIAGNOSIS — F1410 Cocaine abuse, uncomplicated: Secondary | ICD-10-CM | POA: Diagnosis present

## 2012-02-01 NOTE — Clinical Social Work Note (Signed)
  Type of Therapy: Process Group Therapy  Participation Level:  Active  Participation Quality:  Attentive  Affect:  Appropriate  Cognitive:  Alert  Insight:  Improving  Engagement in Group:  Engaged  Engagement in Therapy:  Engaged  Modes of Intervention:  Activity, Clarification, Education, Problem-solving and Support  Summary of Progress/Problems: Today's group addressed the issue of overcoming obstacles.  Patients were asked to identify their biggest obstacle post d/c that stands in the way of their on-going success, and then problem solve as to how to manage this. Severina stated her biggest obstacle is Peyton Najjar, and she needs to figure out how to stay away from him.  She has not been able to say no to him in the past, but she knows if she is feeling weak, she can call her brother for help, and he will always be supportive.       Daryel Gerald B 02/01/2012   2:05 PM

## 2012-02-01 NOTE — Progress Notes (Signed)
Patient ID: [name], female   DOB: October 04, 1965, 47 y.o.   MRN: [id number]  Problem: Schizoaffective Disorder, Bipolar, Cocaine Abuse  D: Patient pleasant and cooperative with staff and interacting well in milieu. A: Monitor patient Q 15 minutes for safety, encourage peer interaction and group participation. Administer medications as ordered by MD. R: Pt denies SI or plans to harm herself at this time.

## 2012-02-01 NOTE — Progress Notes (Signed)
Pt denies SI/HI/AVH. Pt c/o headache and was given prn tylenol. Pt has been engaged on the milieu. Pt voiced concerned about being d/c tomorrow and voiced that she will attend treatment once she is d/c. Pt denies feeling depressed and states that she feels well. Pt verbalized that her goal is to  "not come back here" [facility]). Pt has been cooperative with treatment.  Medications administered as ordered per MD. Verbal support given. Pt encouraged to attend groups. 15 minute checks performed for safety.   Pt insightful about treatment plans. Pt is pleasant. Pt compliant with treatment (meds and groups).

## 2012-02-01 NOTE — Treatment Plan (Signed)
Interdisciplinary Treatment Plan Update (Adult)  Date: 02/01/2012  Time Reviewed: 9:58 AM   Progress in Treatment: Attending groups: Yes Participating in groups: Yes Taking medication as prescribed: Yes Tolerating medication: Yes   Family/Significant other contact made:  Yes  ACT team Patient understands diagnosis:  Yes  As evidenced by asking for help with depression, substance abuse Discussing patient identified problems/goals with staff:  Yes  See below Medical problems stabilized or resolved:  Yes Denies suicidal/homicidal ideation: Yes  In tx team Issues/concerns per patient self-inventory:  Not filled out Other:  New problem(s) identified: N/A  Reason for Continuation of Hospitalization: Medication stabilization  Interventions implemented related to continuation of hospitalization:  Resume home meds  Encourage group attendance and participation  CM to coordinate with ACT team  Additional comments:  Estimated length of stay: 1-2 days  Discharge Plan: return home, follow up [facility]  New goal(s): N/A  Review of initial/current patient goals per problem list:   1.  Goal(s): Eliminate SI  Met:  Yes  Target date:01/06  As evidenced ZO:XWRU report  2.  Goal (s): Stabilize mood  Met:  Yes  Target date:01/06  As evidenced EA:VWUJW denies both depression and anxiety today  3.  Goal(s): Identify comprehensive sobriety plan  Met:  Yes  Target date:01/06  As evidenced JX:BJYNW states she will work with the substance abuse specialist on the ACT team, and will also increase her attendance percentage at groups  4.  Goal(s):  Met:  Yes  Target date:  As evidenced by:  Attendees: Patient:  [name] 02/01/2012 9:58 AM  Family:     Physician:  Thedore Mins 02/01/2012 9:58 AM   Nursing:  Joslyn Devon  02/01/2012 9:58 AM   Clinical Social Worker:  Richelle Ito 02/01/2012 9:58 AM   Extender:  Verne Spurr PA 02/01/2012 9:58 AM   Other:     Other:       Other:     Other:      Scribe for Treatment Team:   Ida Rogue, 02/01/2012 9:58 AM

## 2012-02-01 NOTE — Progress Notes (Signed)
Psychoeducational Group Note  Date:  02/01/2012 Time:  1100  Group Topic/Focus:  Self Care:   The focus of this group is to help patients understand the importance of self-care in order to improve or restore emotional, physical, spiritual, interpersonal, and financial health.  Participation Level:  Active  Participation Quality:  Appropriate, Attentive and Supportive  Affect:  Appropriate  Cognitive:  Appropriate  Insight:  Engaged  Engagement in Group:  Engaged  Additional Comments:Staff explained to the patient that this group is designed to assist in identifying activities that they will incorporate into their daily living with the intention of improving or restoring emotional, physical, spiritual, interpersonal and financial health. The patients were asked to identify one area or skill where they are taking care of themselves. Patients will identify 2-3 activities of self- care that they will use in their daily living after discharge. Patients were encouraged to utilize the skills and techniques taught and apply it in their daily routine.     Ardelle Park O 02/01/2012, 12:00 PM

## 2012-02-01 NOTE — BHH Counselor (Signed)
Adult Comprehensive Assessment  Patient ID: [name], female   DOB: 19-Sep-1965, 47 y.o.   MRN: [id number]  Information Source: Information source: Patient  Current Stressors:  Educational / Learning stressors: N/A Employment / Job issues: N/A Family Relationships: N/A Surveyor, quantity / Lack of resources (include bankruptcy): N/A Housing / Lack of housing: N/A Physical health (include injuries & life threatening diseases): N/A Social relationships: Yes  Isolation Substance abuse: Yes  Relapse on cocaine recently Bereavement / Loss: N/A  Living/Environment/Situation:  Living Arrangements: Alone Living conditions (as described by patient or guardian): comfortable, quiet apartment How long has patient lived in current situation?: 1.5 yrs What is atmosphere in current home: Comfortable  Family History:  Marital status: Single Does patient have children?: No  Childhood History:  By whom was/is the patient raised?: Mother Additional childhood history information: Father never in the picture Description of patient's relationship with caregiver when they were a child: Good with mother Patient's description of current relationship with people who raised him/her: Good with mother Does patient have siblings?: Yes Number of Siblings: 1  Description of patient's current relationship with siblings: Brother-good Did patient suffer any verbal/emotional/physical/sexual abuse as a child?: No Did patient suffer from severe childhood neglect?: No Has patient ever been sexually abused/assaulted/raped as an adolescent or adult?: No Was the patient ever a victim of a crime or a disaster?: No Witnessed domestic violence?: No Has patient been effected by domestic violence as an adult?: No  Education:  Highest grade of school patient has completed: 7th Currently a Consulting civil engineer?: No Learning disability?: Yes What learning problems does patient have?: Unsure  Employment/Work Situation:   Employment  situation: On disability Why is patient on disability: Mental health How long has patient been on disability: In her late 20's What is the longest time patient has a held a job?: 2 years 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 [place] Has patient ever been in the [employer]?: No Has patient ever served in Buyer, retail?: No  Financial Resources:   Financial resources: Insurance claims handler Does patient have a Lawyer or guardian?: No  Alcohol/Substance Abuse:   What has been your use of drugs/alcohol within the last 12 months?: Cocaine binge for 3 days prior to admission Alcohol/Substance Abuse Treatment Hx: Past Tx, Inpatient If yes, describe treatment: [facility] Has alcohol/substance abuse ever caused legal problems?: No  Social Support System:   Conservation officer, nature Support System: Good Describe Community Support System: Family Type of faith/religion: Baptist How does patient's faith help to cope with current illness?: Makes me stronger  Leisure/Recreation:   Leisure and Hobbies: take care of God kids  Strengths/Needs:   What things does the patient do well?: clean, organize, take care of kids In what areas does patient struggle / problems for patient: None  Discharge Plan:   Does patient have access to transportation?: Yes Will patient be returning to same living situation after discharge?: Yes Currently receiving community mental health services: Yes (From Whom) ([facility]) Does patient have financial barriers related to discharge medications?: No  Summary/Recommendations:   Summary and Recommendations (to be completed by the evaluator): [name] is a 47 YO disabled Aa female who has been doing well in the recent past given the frequency with which she was returning to the hospital several years ago.  She has not been here for a year and a half, and this is likely due to her investment in sobriety and the support of her ACT team.  She can benefit  from medication management, therapeutic milieu and coordination with outpt provider.  Stacy Gerald B. 02/01/2012

## 2012-02-01 NOTE — Progress Notes (Signed)
[facility] LCSW Aftercare Discharge Planning Group Note  02/01/2012 9:15 AM  Participation Quality:  Appropriate  Affect:  Appropriate  Cognitive:  Alert  Insight:  Limited  Engagement in Group:  Engaged  Modes of Intervention:  Discussion, Exploration and Socialization  Summary of Progress/Problems: Demari admitted to thoughts of self harm prior to admission.  She works with [facility] team, and states she is ready to leave as she is no longer has SI.  Has her own apartment, and admits to relapsing on alcohol prior to admission.  [UDS - for alcohol, positive for cocaine].  Has not told ACT team she is here, I assigned her to do that today. Daryel Gerald B 02/01/2012, 9:15 AM

## 2012-02-01 NOTE — Progress Notes (Signed)
Psychoeducational Group Note  Date:  02/01/2012 Time:  2000  Group Topic/Focus:  Wrap-Up Group:   The focus of this group is to help patients review their daily goal of treatment and discuss progress on daily workbooks.  Participation Level: Did Not Attend  Participation Quality:  Not Applicable  Affect:  Not Applicable  Cognitive:  Not Applicable  Insight:  Not Applicable  Engagement in Group: Not Applicable  Additional Comments:  Pt did not attend the evening session.  Christ Kick 02/01/2012, 9:54 PM

## 2012-02-01 NOTE — Progress Notes (Signed)
[facility] MD Progress Note  02/01/2012 11:44 AM [name]  MRN:  [id number] Subjective:  "I'm feeling better and I'm ready to go." Patient was admitted yesterday after being found walking in traffic with the intent to kill herself. She admits she relapsed 3 days prior on cocaine after being clean for two years.  Diagnosis:  Schizoaffective disorder                       Cocaine abuse  ADL's:  Intact  Sleep: Good  Appetite:  Good  Suicidal Ideation:  deneis Homicidal Ideation:  denies AEB (as evidenced by):Patient's verbal response, affect, eye contact, and participation in unit programming.  Psychiatric Specialty Exam: Review of Systems  Constitutional: Negative.  Negative for fever, chills, weight loss, malaise/fatigue and diaphoresis.  HENT: Negative for congestion and sore throat.   Eyes: Negative for blurred vision, double vision and photophobia.  Respiratory: Negative for cough, shortness of breath and wheezing.   Cardiovascular: Negative for chest pain, palpitations and PND.  Gastrointestinal: Negative for heartburn, nausea, vomiting, abdominal pain, diarrhea and constipation.  Musculoskeletal: Negative for myalgias, joint pain and falls.  Neurological: Negative for dizziness, tingling, tremors, sensory change, speech change, focal weakness, seizures, loss of consciousness, weakness and headaches.  Endo/Heme/Allergies: Negative for polydipsia. Does not bruise/bleed easily.  Psychiatric/Behavioral: Negative for depression, suicidal ideas, hallucinations, memory loss and substance abuse. The patient is not nervous/anxious and does not have insomnia.     Blood pressure 119/72, pulse 85, temperature 97 F (36.1 C), temperature source Oral, resp. rate 20, height 5' 3.39" (1.61 m), weight 107.049 kg (236 lb), last menstrual period 01/22/2012.Body mass index is 41.30 kg/(m^2).  General Appearance: Fairly Groomed  Patent attorney::  Good  Speech:  Normal Rate  Volume:  Normal  Mood:   Anxious and Depressed  Affect:  Congruent  Thought Process:  Coherent and Goal Directed  Orientation:  Full (Time, Place, and Person)  Thought Content:  WDL  Suicidal Thoughts:  No  Homicidal Thoughts:  No  Memory:  Immediate;   Fair Recent;   Fair Remote;   Fair  Judgement:  Fair  Insight:  Fair  Psychomotor Activity:  Normal  Concentration:  Fair  Recall:  Fair  Akathisia:  No  Handed:    AIMS (if indicated):     Assets:  Communication Skills Desire for Improvement Housing  Sleep:  Number of Hours: 6.5    Current Medications: Current Facility-Administered Medications  Medication Dose Route Frequency Provider Last Rate Last Dose  . acetaminophen (TYLENOL) tablet 650 mg  650 mg Oral Q6H PRN Sanjuana Kava, NP   650 mg at 01/31/12 1402  . alum & mag hydroxide-simeth (MAALOX/MYLANTA) 200-200-20 MG/5ML suspension 30 mL  30 mL Oral Q4H PRN Sanjuana Kava, NP      . FLUoxetine (PROZAC) capsule 20 mg  20 mg Oral Daily Mike Craze, MD   20 mg at 02/01/12 0801  . magnesium hydroxide (MILK OF MAGNESIA) suspension 30 mL  30 mL Oral Daily PRN Sanjuana Kava, NP      . ondansetron (ZOFRAN-ODT) disintegrating tablet 4 mg  4 mg Oral Q8H PRN Nanine Means, NP   4 mg at 01/30/12 1345  . QUEtiapine (SEROQUEL) tablet 100 mg  100 mg Oral QID Mike Craze, MD   100 mg at 02/01/12 1135  . QUEtiapine (SEROQUEL) tablet 200 mg  200 mg Oral QHS PRN Mike Craze, MD   200  mg at 01/31/12 2052  . traZODone (DESYREL) tablet 50 mg  50 mg Oral QHS PRN,MR X 1 Sanjuana Kava, NP   50 mg at 01/31/12 2052    Lab Results: No results found for this or any previous visit (from the past 48 hour(s)).  Physical Findings: AIMS: Facial and Oral Movements Muscles of Facial Expression: None, normal Lips and Perioral Area: None, normal Jaw: None, normal Tongue: None, normal,Extremity Movements Upper (arms, wrists, hands, fingers): None, normal Lower (legs, knees, ankles, toes): None, normal, Trunk Movements Neck,  shoulders, hips: None, normal, Overall Severity Severity of abnormal movements (highest score from questions above): None, normal Incapacitation due to abnormal movements: None, normal Patient's awareness of abnormal movements (rate only patient's report): No Awareness, Dental Status Current problems with teeth and/or dentures?: No Does patient usually wear dentures?: No  CIWA:  CIWA-Ar Total: 0  COWS:  COWS Total Score: 0   Treatment Plan Summary: Daily contact with patient to assess and evaluate symptoms and progress in treatment Medication management  Plan: 1. Patient is clearing well from symptoms and responding well to medication. 2. Will continue to follow and if continues to do well will likely discharge out tomorrow. Medical Decision Making Problem Points:  Established problem, stable/improving (1) and Review of psycho-social stressors (1) Data Points:  Review of medication regiment & side effects (2)  I certify that inpatient services furnished can reasonably be expected to improve the patient's condition.  Rona Ravens. Barabara Motz PAC 02/01/2012, 11:44 AM

## 2012-02-01 NOTE — Progress Notes (Signed)
Patient ID: ULDINE FUSTER, female   DOB: Jul 20, 1965, 47 y.o.   MRN: [id number] D)  Has been out on the hall this evening, came to the med window shortly after beginning of shift and wanted her hs meds so that she could go to bed.  Stated was tired and initially didn't want to go to group, but agreed [place], and came back for meds afterward.  Has been pleasant and cooperative this evening, interacting appropriately with staff and peers.  Affect seems a little brighter, smiles appropriately no c/o's voiced, other than being tired.   A) Will continue to monitor q 15 minutes for safety, continue POC. R)  Safety maintained on unit.

## 2012-02-02 DIAGNOSIS — F329 Major depressive disorder, single episode, unspecified: Secondary | ICD-10-CM

## 2012-02-02 DIAGNOSIS — F1410 Cocaine abuse, uncomplicated: Principal | ICD-10-CM

## 2012-02-02 MED ORDER — TRAZODONE HCL 50 MG PO TABS: 50.0000 mg | ORAL_TABLET | Freq: Every evening | ORAL | Status: DC | PRN

## 2012-02-02 MED ORDER — FLUOXETINE HCL 20 MG PO CAPS: 20.0000 mg | ORAL_CAPSULE | Freq: Every day | ORAL | Status: DC

## 2012-02-02 MED ORDER — QUETIAPINE FUMARATE 100 MG PO TABS: ORAL_TABLET | ORAL | Status: DC

## 2012-02-02 NOTE — Progress Notes (Signed)
Patient ID: [name], female   DOB: January 20, 1966, 47 y.o.   MRN: [id number]  Patient awake, did not allow her roommate who was admitted during the night, into her room and closed the door as she approached. RN entered room and inquired as to what was going on. Pt began to yell at this RN stating, "I didn't do nothing! I'm leaving today anyway so I don't care!". Pt began to pack her belongings in paper bags. Pt declined to lower her voice and remained agitated and difficult to redirect. Pt labile with staff/peers.

## 2012-02-02 NOTE — Progress Notes (Signed)
Patient ID: [name], female   DOB: 17-Jun-1965, 47 y.o.   MRN: [id number] Discharged note: pt discharged to self. Pt left via ACT team staff. Pt denies SI/HI/AVH. Pt denies pain and show no s/s of distress. Pt received both written and verbal discharge instructions. Pt agreed to f/u appointments and medication regimen. Pt received sample meds and prescriptions. Pt received all belonging from room and locker. Pt was cooperative during process.

## 2012-02-02 NOTE — BHH Suicide Risk Assessment (Signed)
Suicide Risk Assessment  Discharge Assessment     Demographic Factors:  Low socioeconomic status, Unemployed and female  Mental Status Per Nursing Assessment::   On Admission:  Suicidal ideation indicated by patient;Self-harm thoughts;Self-harm behaviors;Intention to act on suicide plan  Current Mental Status by Physician: patient denies sucidal ideation, intent or plan  Loss Factors: Financial problems/change in socioeconomic status  Historical Factors: NA  Risk Reduction Factors:   Positive social support and Positive therapeutic relationship  Continued Clinical Symptoms:  Resolving depressive symptoms  Cognitive Features That Contribute To Risk:  Closed-mindedness    Suicide Risk:  Minimal: No identifiable suicidal ideation.  Patients presenting with no risk factors but with morbid ruminations; may be classified as minimal risk based on the severity of the depressive symptoms  Discharge Diagnoses:   AXIS I: Schizoaffective disorder-depressive.  Cocaine abuse  AXIS II:  Deferred AXIS III:   Past Medical History  Diagnosis Date  . Hypertension   . Traumatic brain injury    AXIS IV:  other psychosocial or environmental problems AXIS V:  61-70 mild symptoms  Plan Of Care/Follow-up recommendations:  Activity:  as tolerated Diet:  healthy Tests:  routine Other:  patient to keep her after care appointment  Is patient on multiple antipsychotic therapies at discharge:  No   Has Patient had three or more failed trials of antipsychotic monotherapy by history:  No  Recommended Plan for Multiple Antipsychotic Therapies: N/A  Jazzelle Zhang,MD 02/02/2012, 10:18 AM

## 2012-02-02 NOTE — Discharge Summary (Signed)
Physician Discharge Summary Note  Patient:  [name] is an 47 y.o., female MRN:  [id number] DOB:  08/08/1965 Patient phone:  [phone] (home)  Patient address:   [address] [place] [address],   Date of Admission:  01/29/2012 Date of Discharge: 02/02/2011  Reason for Admission: psychosis and worsening depression s/p relapse on cocaine Discharge Diagnoses: Principal Problem:  *Cocaine abuse Active Problems:  DISORDER, SCHIZOAFFECTIVE, UNSPC CHRONIC  DISORDER, BIPOLAR NEC  Review of Systems  Constitutional: Negative.  Negative for fever, chills, weight loss, malaise/fatigue and diaphoresis.  HENT: Negative for congestion and sore throat.   Eyes: Negative for blurred vision, double vision and photophobia.  Respiratory: Negative for cough, shortness of breath and wheezing.   Cardiovascular: Negative for chest pain, palpitations and PND.  Gastrointestinal: Negative for heartburn, nausea, vomiting, abdominal pain, diarrhea and constipation.  Musculoskeletal: Negative for myalgias, joint pain and falls.  Neurological: Negative for dizziness, tingling, tremors, sensory change, speech change, focal weakness, seizures, loss of consciousness, weakness and headaches.  Endo/Heme/Allergies: Negative for polydipsia. Does not bruise/bleed easily.  Psychiatric/Behavioral: Negative for depression, suicidal ideas, hallucinations, memory loss and substance abuse. The patient is not nervous/anxious and does not have insomnia.   Discharge Diagnoses:  AXIS I: Schizoaffective disorder-depressive. Cocaine abuse  AXIS II: Deferred  AXIS III:  Past Medical History   Diagnosis  Date   .  Hypertension    .  Traumatic brain injury    AXIS IV: other psychosocial or environmental problems  AXIS V: 61-70 mild symptoms  Level of Care:  OP  Hospital Course:  [name] reported to the ED stating that she was having increasing symptoms of depression and anxiety with significant worry, and  auditory hallucinations.  She did also note that she had been on a 3 day cocaine binge having relapsed recently after being clean for 2 years.  [name] was last admitted in August of 2012 and went to [facility] in [place] on discharge.      Her symptoms of anxiety, auditory hallucinations, depression, anhedonia, poor self care, feelings of worthlessness, hopelessness and guilt were treated with Seroquel 100mg  TID and 200mg  at hs, Prozac 20mg  po qd for depression and trazodone for sleep.  [name]'s response to treatment was evaluated daily by clinical providers.  Her improvement was noted by her response to inquiry, affect, reports of reduction in symptoms, improved sleep and increased appetite. By the third day she was much improved than upon arrival and stated that her symptoms had resolved. [name] was offered further treatment for her substance abuse, but declined at this time.  She denied SI/HI and AVH and was in full contact with reality. [name] was felt to be at base line and stable for discharge.  Consults:  None  Significant Diagnostic Studies:  None  Discharge Vitals:   Blood pressure 114/77, pulse 97, temperature 98.3 F (36.8 C), temperature source Oral, resp. rate 18, height 5' 3.39" (1.61 m), weight 107.049 kg (236 lb), last menstrual period 01/22/2012. Body mass index is 41.30 kg/(m^2). Lab Results:   No results found for this or any previous visit (from the past 72 hour(s)).  Physical Findings: AIMS: Facial and Oral Movements Muscles of Facial Expression: None, normal Lips and Perioral Area: None, normal Jaw: None, normal Tongue: None, normal,Extremity Movements Upper (arms, wrists, hands, fingers): None, normal Lower (legs, knees, ankles, toes): None, normal, Trunk Movements Neck, shoulders, hips: None, normal, Overall Severity Severity of abnormal movements (highest score from questions above): None, normal Incapacitation  due to abnormal movements: None, normal Patient's awareness of  abnormal movements (rate only patient's report): No Awareness, Dental Status Current problems with teeth and/or dentures?: No Does patient usually wear dentures?: No  CIWA:  CIWA-Ar Total: 0  COWS:  COWS Total Score: 0   Psychiatric Specialty Exam: See Psychiatric Specialty Exam and Suicide Risk Assessment completed by Attending Physician prior to discharge.  Discharge destination:  Home  Is patient on multiple antipsychotic therapies at discharge:  No   Has Patient had three or more failed trials of antipsychotic monotherapy by history:  No  Recommended Plan for Multiple Antipsychotic Therapies: Not applicable   Discharge Orders    Future Appointments: Provider: Department: Dept Phone: Center:   02/09/2012 2:45 PM [facility] [phone] [facility]   02/23/2012 10:00 AM Gardiner Barefoot, MD [facility] for [facility] [phone] [facility]     Future Orders Please Complete By Expires   Diet - low sodium heart healthy      Increase activity slowly      Discharge instructions      Comments:   Take all your medications as prescribed by your mental healthcare provider. Report any adverse effects and or reactions from your medicines to your outpatient provider promptly. Patient is instructed and cautioned to not engage in alcohol and or illegal drug use while on prescription medicines. In the event of worsening symptoms, patient is instructed to call the crisis hotline, 911 and or go to the nearest ED for appropriate evaluation and treatment of symptoms. Follow-up with your primary care provider for your other medical issues, concerns and or health care needs.       Medication List     As of 02/02/2012  3:37 PM    STOP taking these medications         zolpidem 10 MG tablet   Commonly known as: AMBIEN      TAKE these medications      Indication    FLUoxetine 20 MG capsule   Commonly known as: PROZAC   Take 1  capsule (20 mg total) by mouth daily. For depression.    Indication: Depression      QUEtiapine 100 MG tablet   Commonly known as: SEROQUEL   One tablet 4 times a day (100mg ) for mental clarity and depression, and two tablets at bedtime for sleep (200mg ).    Indication: Depression, mental clarity, and insomnia.      traZODone 50 MG tablet   Commonly known as: DESYREL   Take 1 tablet (50 mg total) by mouth at bedtime as needed and may repeat dose one time if needed for sleep.    Indication: Trouble Sleeping           Follow-up Information    Follow up with [facility] team. Peyton Najjar will pick you up at 11:30)    Contact information:   [address]  [place]  [phone]         Follow-up recommendations:  As noted above  Comments:   Total Discharge Time:  >30 minutes Signed:  Lloyd Huger T. Salah Burlison PAC 02/02/2012, 3:37 PM

## 2012-02-02 NOTE — Progress Notes (Signed)
[facility] INPATIENT:  Family/Significant Other Suicide Prevention Education  Suicide Prevention Education:  Education Completed; Yesennia Hirota, mother, has been identified by the patient as the family member/significant other with whom the patient will be residing, and identified as the person(s) who will aid the patient in the event of a mental health crisis (suicidal ideations/suicide attempt).  With written consent from the patient, the family member/significant other has been provided the following suicide prevention education, prior to the and/or following the discharge of the patient.  The suicide prevention education provided includes the following:  Suicide risk factors  Suicide prevention and interventions  National Suicide Hotline telephone number  [facility] assessment telephone number  [facility] Emergency Assistance 911  [facility] and/or Residential Mobile Crisis Unit telephone number  Request made of family/significant other to:  Remove weapons (e.g., guns, rifles, knives), all items previously/currently identified as safety concern.    Remove drugs/medications (over-the-counter, prescriptions, illicit drugs), all items previously/currently identified as a safety concern.  The family member/significant other verbalizes understanding of the suicide prevention education information provided.  The family member/significant other agrees to remove the items of safety concern listed above.  Daryel Gerald B 02/02/2012, 11:35 AM

## 2012-02-02 NOTE — Progress Notes (Signed)
Psychoeducational Group Note  Date:  02/02/2012 Time:  930  Group Topic/Focus:  Recovery Goals:   The focus of this group is to identify appropriate goals for recovery and establish a plan to achieve them.  Participation Level:  Active  Participation Quality:  Appropriate, Attentive and Sharing  Affect:  Appropriate  Cognitive:  Appropriate  Insight:  Supportive  Engagement in Group:  Engaged  Additional Comments:  Staff explained to the patient that the purpose of this group is to educate them on recovery, and on setting mid-range to long-term personal goals for their recovery process.  Staff also explained that the group will also address topics including what recovery is, who goes through recovery, the first steps toward recovery, and setting realistic goals for recovery. Patient was asked to identify two areas of their life in which they would like to make a change toward recovery, and set a specific measurable goal to address each change identified. Patient was provided with a homework assignment regarding how this goal impacts their personal recovery. Staff concluded the group by encouraging the patient to take a proactive approach to accomplishing their recovery goals.     Ardelle Park O 02/02/2012, 10:19 AM

## 2012-02-02 NOTE — Progress Notes (Signed)
[facility] Adult Case Management Discharge Plan :  Will you be returning to the same living situation after discharge: Yes,  home At discharge, do you have transportation home?:Yes,  ACT team Do you have the ability to pay for your medications:Yes,  MCD  Release of information consent forms completed and in the chart;  Patient's signature needed at discharge.  Patient to Follow up at: Follow-up Information    Follow up with [facility] ACT team. Peyton Najjar will pick you up at 11:30)    Contact information:   [address]  [place]  [phone]         Patient denies SI/HI:   Yes,  yes    Safety Planning and Suicide Prevention discussed:  Yes,  yes  Daryel Gerald B 02/02/2012, 10:26 AM

## 2012-02-03 NOTE — Discharge Summary (Signed)
Seen and agreed. Sary Bogie, MD 

## 2012-02-05 NOTE — Progress Notes (Signed)
Patient Discharge Instructions:  After Visit Summary (AVS):   Faxed to:  02/05/12 Psychiatric Admission Assessment Note:   Faxed to:  02/05/12 Suicide Risk Assessment - Discharge Assessment:   Faxed to:  02/05/12 Faxed/Sent to the Next Level Care provider:  02/05/12 Faxed to [facility] @ [phone]  Jerelene Redden, 02/05/2012, 3:23 PM

## 2012-02-09 ENCOUNTER — Other Ambulatory Visit: Payer: Self-pay

## 2012-02-22 ENCOUNTER — Other Ambulatory Visit: Payer: Self-pay

## 2012-02-23 ENCOUNTER — Ambulatory Visit: Payer: Self-pay | Admitting: Internal Medicine

## 2012-03-02 ENCOUNTER — Ambulatory Visit: Payer: Self-pay | Admitting: Internal Medicine

## 2012-03-03 ENCOUNTER — Telehealth: Payer: Self-pay | Admitting: *Deleted

## 2012-03-03 NOTE — Telephone Encounter (Signed)
Phone calls to patient's listed number were unanswered.  RN called patient's emergency contact, verified that we have the correct phone number.  Mother will contact patient and have her call us to make lab and follow up appointments.  Referral sent to [facility] d/t patient's pattern of no show visits and recent drug relapse. Andree Coss, RN

## 2012-03-04 ENCOUNTER — Other Ambulatory Visit: Payer: Medicaid Other

## 2012-03-04 DIAGNOSIS — B20 Human immunodeficiency virus [HIV] disease: Secondary | ICD-10-CM

## 2012-03-04 LAB — COMPLETE METABOLIC PANEL WITH GFR
ALT: 19 U/L (ref 0–35)
AST: 19 U/L (ref 0–37)
Albumin: 3.8 g/dL (ref 3.5–5.2)
Alkaline Phosphatase: 90 U/L (ref 39–117)
GFR, Est Non African American: 70 mL/min
Glucose, Bld: 104 mg/dL — ABNORMAL HIGH (ref 70–99)
Potassium: 4.6 mEq/L (ref 3.5–5.3)
Sodium: 137 mEq/L (ref 135–145)
Total Bilirubin: 0.3 mg/dL (ref 0.3–1.2)
Total Protein: 6.5 g/dL (ref 6.0–8.3)

## 2012-03-04 LAB — CBC WITH DIFFERENTIAL/PLATELET
Basophils Absolute: 0 10*3/uL (ref 0.0–0.1)
Basophils Relative: 1 % (ref 0–1)
Eosinophils Absolute: 0.2 10*3/uL (ref 0.0–0.7)
Hemoglobin: 11.7 g/dL — ABNORMAL LOW (ref 12.0–15.0)
MCH: 27.3 pg (ref 26.0–34.0)
MCHC: 33.9 g/dL (ref 30.0–36.0)
Neutro Abs: 3.1 10*3/uL (ref 1.7–7.7)
Neutrophils Relative %: 51 % (ref 43–77)
Platelets: 294 10*3/uL (ref 150–400)

## 2012-03-08 LAB — HIV-1 RNA QUANT-NO REFLEX-BLD
HIV 1 RNA Quant: 234 copies/mL — ABNORMAL HIGH (ref <20)
HIV-1 RNA Quant, Log: 2.37 {Log} — ABNORMAL HIGH (ref <1.30)

## 2012-03-17 ENCOUNTER — Ambulatory Visit: Payer: Self-pay | Admitting: Internal Medicine

## 2012-03-17 ENCOUNTER — Telehealth: Payer: Self-pay | Admitting: *Deleted

## 2012-03-17 NOTE — Telephone Encounter (Signed)
Patient rescheduled, she no showed today. Explained that if she has another no show she will have to be seen as a walk in. Wendall Mola

## 2012-04-26 ENCOUNTER — Ambulatory Visit (INDEPENDENT_AMBULATORY_CARE_PROVIDER_SITE_OTHER): Payer: Medicaid Other | Admitting: Internal Medicine

## 2012-04-26 ENCOUNTER — Encounter: Payer: Self-pay | Admitting: Internal Medicine

## 2012-04-26 VITALS — BP 122/73 | HR 85 | Temp 97.5°F | Ht 64.0 in | Wt 215.0 lb

## 2012-04-26 DIAGNOSIS — B20 Human immunodeficiency virus [HIV] disease: Secondary | ICD-10-CM

## 2012-04-26 NOTE — Assessment & Plan Note (Signed)
At this time, she will remain without treatment though does tell me that as her viral load becomes more active or CD4 count decreases she would be then interested in taking medication. At this time however she would like to keep her regimen simplified and would like to defer for now. I did discuss with her the problems with active virus including but not limited to renal failure heart failure and other issues. She though would like to continue to wait as long as she feels well.

## 2012-04-26 NOTE — Addendum Note (Signed)
Addended by: Wendall Mola A on: 04/26/2012 02:39 PM   Modules accepted: Orders

## 2012-04-26 NOTE — Progress Notes (Signed)
  Subjective:    Patient ID: [name], female    DOB: 10-12-1965, 47 y.o.   MRN: [id number]  HPI Is in for followup of her HIV. 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  She has no complaints since her last visit. She is due for a Pap smear. She remains off of therapy.   Review of Systems  Constitutional: Negative for fatigue and unexpected weight change.  Respiratory: Negative for cough and shortness of breath.   Gastrointestinal: Negative for nausea, abdominal pain and diarrhea.  Skin: Negative for rash.  Neurological: Negative for dizziness and headaches.       Objective:   Physical Exam  Constitutional: She appears well-developed and well-nourished. No distress.  HENT:  Poor dentition  Cardiovascular: Normal rate, regular rhythm and normal heart sounds.  Exam reveals no gallop and no friction rub.   No murmur heard. Pulmonary/Chest: Effort normal and breath sounds normal. No respiratory distress. She has no wheezes. She has no rales.          Assessment & Plan:

## 2012-08-08 IMAGING — CR DG CHEST 1V PORT
1 series · 1 of 1 positions shown · non-contrast
Comparison: 12/05/2009

CLINICAL DATA: Intubation

PORTABLE CHEST - 1 VIEW

[AP]
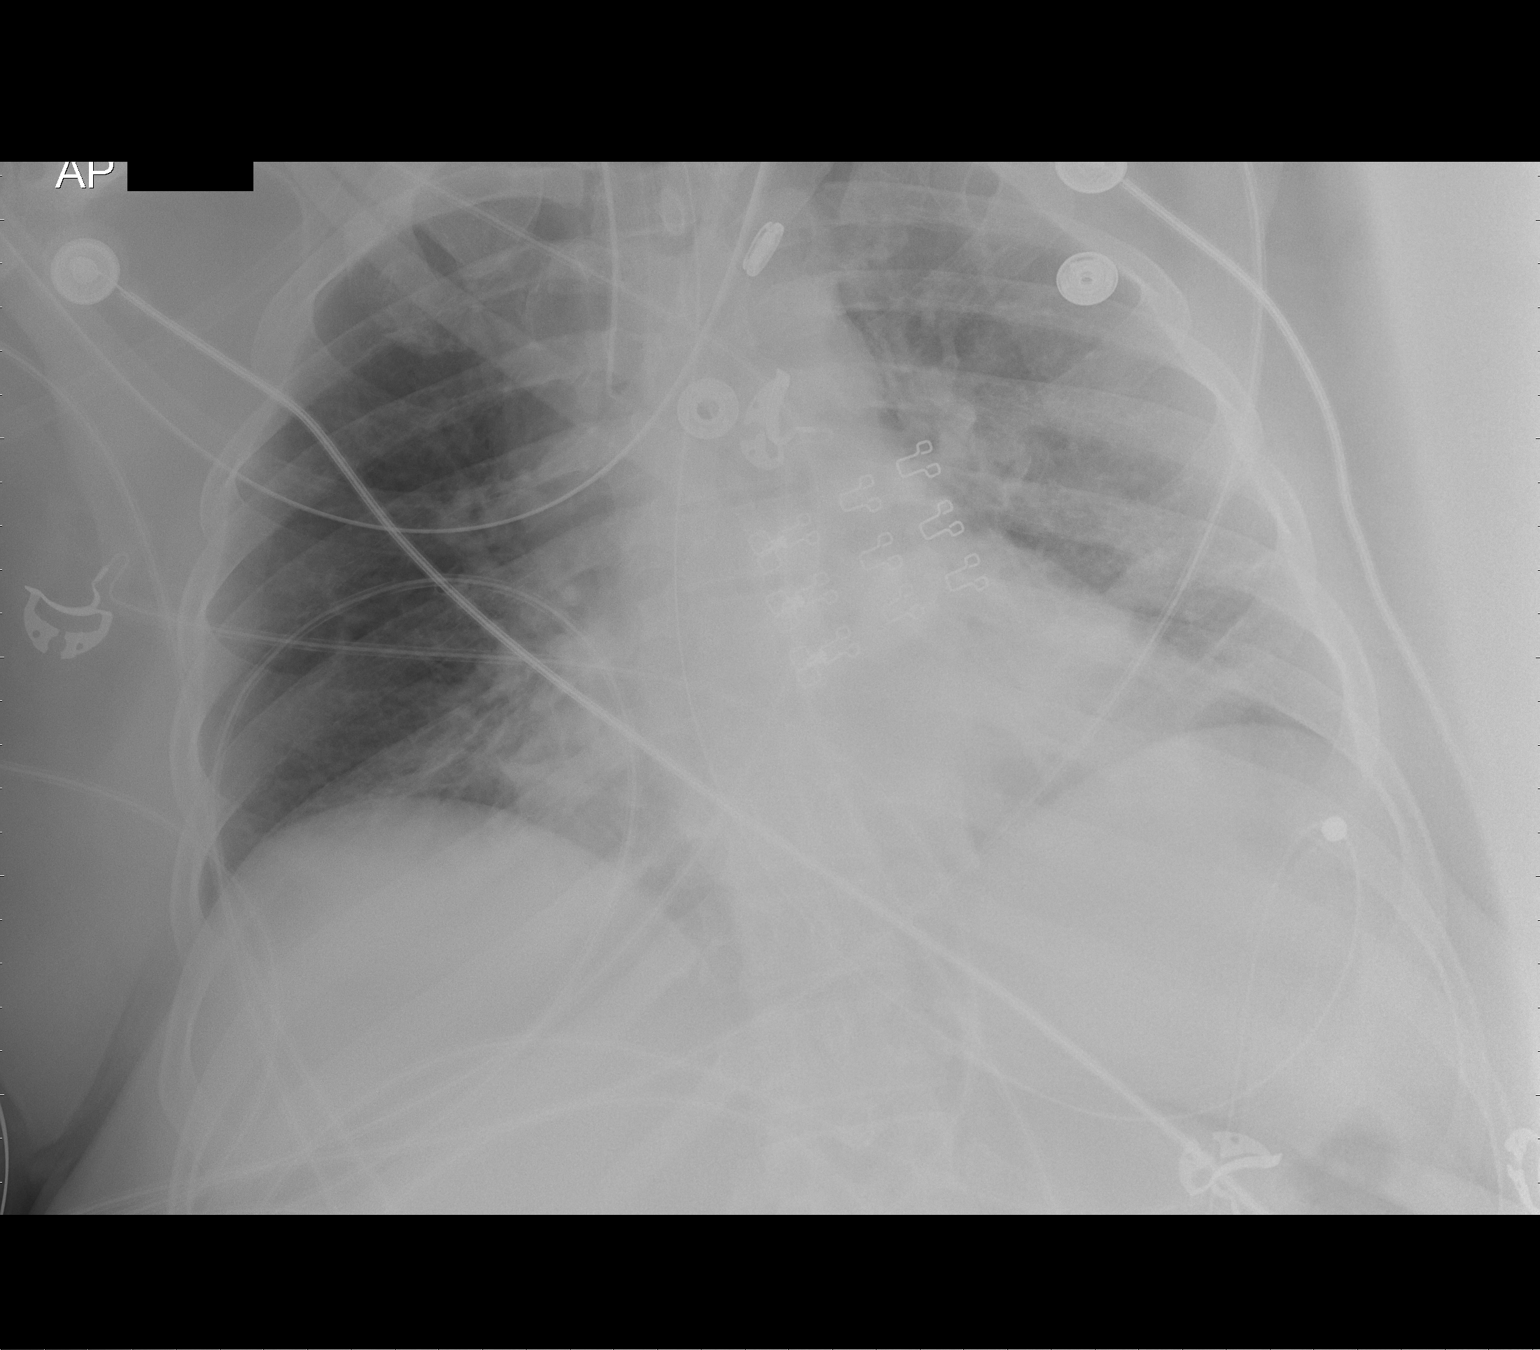

[1 of 1 positions shown; findings below may reference images not displayed]

FINDINGS: Endotracheal tube approximately 2 cm above the carina.
Bibasilar atelectasis.  NG tube is coiled in the stomach.
IMPRESSION: Endotracheal tube is low approximately 2 cm above the carina

Hypoventilation with bibasilar atelectasis.

## 2012-08-15 ENCOUNTER — Encounter (HOSPITAL_COMMUNITY): Payer: Self-pay | Admitting: *Deleted

## 2012-08-15 ENCOUNTER — Emergency Department (HOSPITAL_COMMUNITY): Payer: Medicaid Other

## 2012-08-15 ENCOUNTER — Emergency Department (HOSPITAL_COMMUNITY)
Admission: EM | Admit: 2012-08-15 | Discharge: 2012-08-16 | Disposition: A | Discharge status: Home / Self Care | Payer: Medicaid Other | Attending: Emergency Medicine | Admitting: Emergency Medicine

## 2012-08-15 DIAGNOSIS — Z87891 Personal history of nicotine dependence: Secondary | ICD-10-CM | POA: Insufficient documentation

## 2012-08-15 DIAGNOSIS — E669 Obesity, unspecified: Secondary | ICD-10-CM | POA: Insufficient documentation

## 2012-08-15 DIAGNOSIS — F1210 Cannabis abuse, uncomplicated: Secondary | ICD-10-CM | POA: Insufficient documentation

## 2012-08-15 DIAGNOSIS — R079 Chest pain, unspecified: Secondary | ICD-10-CM | POA: Insufficient documentation

## 2012-08-15 DIAGNOSIS — F319 Bipolar disorder, unspecified: Secondary | ICD-10-CM | POA: Insufficient documentation

## 2012-08-15 DIAGNOSIS — F259 Schizoaffective disorder, unspecified: Secondary | ICD-10-CM | POA: Insufficient documentation

## 2012-08-15 DIAGNOSIS — Z79899 Other long term (current) drug therapy: Secondary | ICD-10-CM | POA: Insufficient documentation

## 2012-08-15 DIAGNOSIS — R000 Tachycardia, unspecified: Secondary | ICD-10-CM | POA: Insufficient documentation

## 2012-08-15 DIAGNOSIS — F1410 Cocaine abuse, uncomplicated: Secondary | ICD-10-CM | POA: Insufficient documentation

## 2012-08-15 DIAGNOSIS — J3489 Other specified disorders of nose and nasal sinuses: Secondary | ICD-10-CM | POA: Insufficient documentation

## 2012-08-15 DIAGNOSIS — R059 Cough, unspecified: Secondary | ICD-10-CM | POA: Insufficient documentation

## 2012-08-15 DIAGNOSIS — Z21 Asymptomatic human immunodeficiency virus [HIV] infection status: Secondary | ICD-10-CM | POA: Insufficient documentation

## 2012-08-15 DIAGNOSIS — R05 Cough: Secondary | ICD-10-CM

## 2012-08-15 DIAGNOSIS — I10 Essential (primary) hypertension: Secondary | ICD-10-CM | POA: Insufficient documentation

## 2012-08-15 DIAGNOSIS — S58119A Complete traumatic amputation at level between elbow and wrist, unspecified arm, initial encounter: Secondary | ICD-10-CM | POA: Insufficient documentation

## 2012-08-15 DIAGNOSIS — Z87820 Personal history of traumatic brain injury: Secondary | ICD-10-CM | POA: Insufficient documentation

## 2012-08-15 HISTORY — DX: Asymptomatic human immunodeficiency virus (hiv) infection status: Z21

## 2012-08-15 HISTORY — DX: Human immunodeficiency virus (HIV) disease: B20

## 2012-08-15 LAB — CBC WITH DIFFERENTIAL/PLATELET
Eosinophils Relative: 5 % (ref 0–5)
Hemoglobin: 13 g/dL (ref 12.0–15.0)
Lymphocytes Relative: 31 % (ref 12–46)
Lymphs Abs: 2.5 10*3/uL (ref 0.7–4.0)
MCV: 82 fL (ref 78.0–100.0)
Monocytes Relative: 11 % (ref 3–12)
Platelets: 303 10*3/uL (ref 150–400)
RBC: 4.6 MIL/uL (ref 3.87–5.11)
WBC: 8.1 10*3/uL (ref 4.0–10.5)

## 2012-08-15 LAB — URINALYSIS, ROUTINE W REFLEX MICROSCOPIC
Glucose, UA: NEGATIVE mg/dL
Ketones, ur: NEGATIVE mg/dL
Leukocytes, UA: NEGATIVE
pH: 5.5 (ref 5.0–8.0)

## 2012-08-15 LAB — POCT I-STAT, CHEM 8
BUN: 19 mg/dL (ref 6–23)
Chloride: 104 mEq/L (ref 96–112)
Creatinine, Ser: 1.2 mg/dL — ABNORMAL HIGH (ref 0.50–1.10)
Sodium: 138 mEq/L (ref 135–145)

## 2012-08-15 LAB — URINE MICROSCOPIC-ADD ON

## 2012-08-15 MED ORDER — SODIUM CHLORIDE 0.9 % IV BOLUS (SEPSIS)
1000.0000 mL | Freq: Once | INTRAVENOUS | Status: AC
Start: 2012-08-15 — End: 2012-08-16
  Administered 2012-08-15: 1000 mL via INTRAVENOUS

## 2012-08-15 NOTE — ED Notes (Signed)
Patient states that she has had a cough with chills since Saturday. States that she has been coughing up blood, shortness of breath when she walks and sweating a night.

## 2012-08-15 NOTE — ED Provider Notes (Signed)
History    CSN: [id number] Arrival date & time 08/15/12  2109  First MD Initiated Contact with Patient 08/15/12 2131     Chief Complaint  Patient presents with  . Hemoptysis  . Cough  . Chills   (Consider location/radiation/quality/duration/timing/severity/associated sxs/prior Treatment) Patient is a 47 y.o. female presenting with cough. The history is provided by the patient.  Cough Cough characteristics:  Productive Sputum characteristics:  Bloody Severity:  Moderate Timing:  Intermittent Chronicity:  New Relieved by:  None tried Worsened by:  Nothing tried Ineffective treatments:  None tried Associated symptoms: chest pain and sinus congestion   Associated symptoms: no rash    Past Medical History  Diagnosis Date  . Bipolar disorder   . Schizoaffective disorder   . Suicide and self-inflicted injury   . Hypertension   . Traumatic brain injury   . Substance abuse   . HIV (human immunodeficiency virus infection)    Past Surgical History  Procedure Laterality Date  . 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 Bowel obstruction    . Surgery r/t bowel obstruction     No family history on file. History  Substance Use Topics  . Smoking status: Former Smoker -- 0.50 packs/day for 30 years    Types: Cigarettes  . Smokeless tobacco: Never Used  . Alcohol Use: 0.0 oz/week     Comment: 2-3 beers   OB History   Grav Para Term Preterm Abortions TAB SAB Ect Mult Living                 Review of Systems  Respiratory: Positive for cough.   Cardiovascular: Positive for chest pain.  Skin: Negative for rash.    Allergies  Lithium; Risperidone and related; and Thorazine  Home Medications   Current Outpatient Rx  Name  Route  Sig  Dispense  Refill  . FLUoxetine (PROZAC) 20 MG capsule   Oral   Take 1 capsule (20 mg total) by mouth daily. For depression.   30 capsule   0   . QUEtiapine (SEROQUEL) 100 MG tablet   Oral   Take 100-200 mg by mouth 4 (four) times daily. 1 tab  tid and 2 tabs qhs         . traZODone (DESYREL) 50 MG tablet   Oral   Take 1 tablet (50 mg total) by mouth at bedtime as needed and may repeat dose one time if needed for sleep.   30 tablet   0    BP 128/84  Pulse 121  Temp(Src) 98.2 F (36.8 C) (Oral)  Resp 20  Ht 5\' 4"  (1.626 m)  Wt 235 lb (106.595 kg)  BMI 40.32 kg/m2  SpO2 98%  LMP 08/14/2012 Physical Exam  Constitutional: She is oriented to person, place, and time. She appears well-developed and well-nourished.  obese  HENT:  Head: Normocephalic and atraumatic.  Mouth/Throat: Oropharynx is clear and moist.  Eyes: Pupils are equal, round, and reactive to light.  Neck: Normal range of motion.  Cardiovascular: Regular rhythm.  Tachycardia present.   Pulmonary/Chest: Effort normal and breath sounds normal. No respiratory distress. She has no rales.  Abdominal: Soft. Bowel sounds are normal. She exhibits no distension. There is no tenderness.  Mid line healed surgical scar   Musculoskeletal: Normal range of motion. She exhibits no edema and no tenderness.  L below the elbow amputation   Neurological: She is alert and oriented to person, place, and time.  Skin: Skin is warm and  dry.    ED Course  Procedures (including critical care time) Labs Reviewed  URINALYSIS, ROUTINE W REFLEX MICROSCOPIC - Abnormal; Notable for the following:    Hgb urine dipstick TRACE (*)    All other components within normal limits  POCT I-STAT, CHEM 8 - Abnormal; Notable for the following:    Creatinine, Ser 1.20 (*)    Glucose, Bld 133 (*)    Calcium, Ion 1.07 (*)    All other components within normal limits  CBC WITH DIFFERENTIAL  URINE MICROSCOPIC-ADD ON   Dg Chest 2 View  08/15/2012   *RADIOLOGY REPORT*  Clinical Data: Cough and hemoptysis  CHEST - 2 VIEW  Comparison: December 06, 2010  Findings: There is scarring in the left base.  Lungs are otherwise clear.  Heart size and pulmonary vascularity are normal.  No adenopathy.  No bone  lesions.  IMPRESSION: Scarring left base.  No edema or consolidation.  If hemoptysis persists, correlation with chest CT may be reasonable.   Original Report Authenticated By: Bretta Bang, M.D.   1. Cough     MDM  Xray labs normal    Arman Filter, NP 08/16/12 [id number]

## 2012-08-15 NOTE — ED Notes (Signed)
EMS transport from home. Cough since last night, ?fever. Lungs clear, no distress. HR120 SpO2 98 RA B/P 118/74

## 2012-08-16 NOTE — ED Notes (Signed)
Patient asking frequently to leave. NP aware

## 2012-08-16 NOTE — ED Notes (Signed)
Patient walked out to meet ride. Did not stay in room to sign out or for exit vital signs. The patient given paper work as she was getting in to car to leave

## 2012-08-24 NOTE — ED Provider Notes (Signed)
Medical screening examination/treatment/procedure(s) were performed by non-physician practitioner and as supervising physician I was immediately available for consultation/collaboration.   Dyrell Tuccillo H Chenita Ruda, MD 08/24/12 0703 

## 2012-09-23 ENCOUNTER — Encounter: Payer: Self-pay | Admitting: *Deleted

## 2012-10-13 ENCOUNTER — Other Ambulatory Visit: Payer: Medicaid Other

## 2012-10-13 DIAGNOSIS — B20 Human immunodeficiency virus [HIV] disease: Secondary | ICD-10-CM

## 2012-10-13 DIAGNOSIS — Z79899 Other long term (current) drug therapy: Secondary | ICD-10-CM

## 2012-10-13 DIAGNOSIS — Z113 Encounter for screening for infections with a predominantly sexual mode of transmission: Secondary | ICD-10-CM

## 2012-10-13 LAB — LIPID PANEL: Cholesterol: 180 mg/dL (ref 0–200)

## 2012-10-14 LAB — T-HELPER CELL (CD4) - (RCID CLINIC ONLY)
CD4 % Helper T Cell: 47 % (ref 33–55)
CD4 T Cell Abs: 1080 /uL (ref 400–2700)

## 2012-10-14 LAB — HIV-1 RNA QUANT-NO REFLEX-BLD: HIV-1 RNA Quant, Log: 2.87 {Log} — ABNORMAL HIGH (ref <1.30)

## 2012-10-27 ENCOUNTER — Encounter: Payer: Self-pay | Admitting: Internal Medicine

## 2012-10-27 ENCOUNTER — Ambulatory Visit (INDEPENDENT_AMBULATORY_CARE_PROVIDER_SITE_OTHER): Payer: Medicaid Other | Admitting: Internal Medicine

## 2012-10-27 ENCOUNTER — Ambulatory Visit: Payer: Self-pay | Admitting: *Deleted

## 2012-10-27 VITALS — BP 114/81 | HR 102 | Temp 98.2°F | Ht 64.0 in | Wt 219.0 lb

## 2012-10-27 DIAGNOSIS — I10 Essential (primary) hypertension: Secondary | ICD-10-CM

## 2012-10-27 DIAGNOSIS — Z23 Encounter for immunization: Secondary | ICD-10-CM

## 2012-10-27 DIAGNOSIS — B20 Human immunodeficiency virus [HIV] disease: Secondary | ICD-10-CM

## 2012-10-27 MED ORDER — ELVITEG-COBIC-EMTRICIT-TENOFDF 150-150-200-300 MG PO TABS: 1.0000 | ORAL_TABLET | Freq: Every day | ORAL | Status: DC

## 2012-10-27 NOTE — Assessment & Plan Note (Signed)
This is stable at this time. 

## 2012-10-27 NOTE — Progress Notes (Signed)
  Subjective:    Patient ID: [name], female    DOB: 04-30-1965, 47 y.o.   MRN: [id number]  HPI She comes in for routine follow up.  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  Her most recent labs though show a viral load of 700 and recently was over 1000.  She feels well medcially, though continues to be depressed.  She takes her psychiatry meds daily with no missed doses.  No weight loss, no diarrhea.     Review of Systems  Constitutional: Negative for fever, appetite change and fatigue.  HENT: Negative for sore throat and trouble swallowing.   Eyes: Negative for visual disturbance.  Respiratory: Negative for shortness of breath.   Cardiovascular: Negative for leg swelling.  Gastrointestinal: Negative for nausea, abdominal pain and diarrhea.  Musculoskeletal: Negative for myalgias and arthralgias.  Skin: Negative for rash.  Neurological: Negative for dizziness, light-headedness and headaches.  Hematological: Negative for adenopathy.  Psychiatric/Behavioral: Positive for dysphoric mood. Negative for suicidal ideas and sleep disturbance. The patient is not nervous/anxious.        Objective:   Physical Exam  Constitutional: She appears well-developed and well-nourished. No distress.  HENT:  Mouth/Throat: No oropharyngeal exudate.  Eyes: No scleral icterus.  Cardiovascular: Normal rate, regular rhythm and normal heart sounds.   No murmur heard. Pulmonary/Chest: Effort normal and breath sounds normal. No respiratory distress.  Lymphadenopathy:    She has no cervical adenopathy.  Neurological: She is alert.  Skin: No rash noted.  Psychiatric:  Flat affect          Assessment & Plan:

## 2012-10-27 NOTE — Assessment & Plan Note (Addendum)
She is now interested in treatment and I discussed the different options and she has decided to start Stribild.  She will pick up now and start and I will check her labs in one month.  The pharmacist discussed in detail as well. She will rtc after her labs.    40 minutes spent with patient including 20 minutes of face to face time with counseling, exam

## 2012-11-03 ENCOUNTER — Ambulatory Visit: Payer: Self-pay | Admitting: *Deleted

## 2012-11-08 ENCOUNTER — Ambulatory Visit: Payer: Self-pay | Admitting: *Deleted

## 2013-02-08 ENCOUNTER — Telehealth: Payer: Self-pay | Admitting: *Deleted

## 2013-02-08 NOTE — Telephone Encounter (Signed)
appt made for 02/17/13 @ 1030.  Pt verbalized understanding.

## 2013-02-17 ENCOUNTER — Ambulatory Visit (INDEPENDENT_AMBULATORY_CARE_PROVIDER_SITE_OTHER): Payer: Medicaid Other | Admitting: *Deleted

## 2013-02-17 ENCOUNTER — Other Ambulatory Visit: Payer: Medicaid Other

## 2013-02-17 DIAGNOSIS — B20 Human immunodeficiency virus [HIV] disease: Secondary | ICD-10-CM

## 2013-02-17 DIAGNOSIS — N926 Irregular menstruation, unspecified: Secondary | ICD-10-CM

## 2013-02-17 LAB — CBC WITH DIFFERENTIAL/PLATELET
BASOS ABS: 0 10*3/uL (ref 0.0–0.1)
BASOS PCT: 1 % (ref 0–1)
EOS ABS: 0.2 10*3/uL (ref 0.0–0.7)
EOS PCT: 2 % (ref 0–5)
HEMATOCRIT: 38.6 % (ref 36.0–46.0)
Hemoglobin: 12.8 g/dL (ref 12.0–15.0)
Lymphocytes Relative: 33 % (ref 12–46)
Lymphs Abs: 2.2 10*3/uL (ref 0.7–4.0)
MCH: 27.9 pg (ref 26.0–34.0)
MCHC: 33.2 g/dL (ref 30.0–36.0)
MCV: 84.3 fL (ref 78.0–100.0)
MONO ABS: 0.5 10*3/uL (ref 0.1–1.0)
Monocytes Relative: 7 % (ref 3–12)
NEUTROS ABS: 3.8 10*3/uL (ref 1.7–7.7)
Neutrophils Relative %: 57 % (ref 43–77)
Platelets: 350 10*3/uL (ref 150–400)
RBC: 4.58 MIL/uL (ref 3.87–5.11)
RDW: 15.8 % — AB (ref 11.5–15.5)
WBC: 6.6 10*3/uL (ref 4.0–10.5)

## 2013-02-17 LAB — COMPLETE METABOLIC PANEL WITH GFR
ALK PHOS: 82 U/L (ref 39–117)
ALT: 11 U/L (ref 0–35)
AST: 13 U/L (ref 0–37)
Albumin: 3.6 g/dL (ref 3.5–5.2)
BUN: 11 mg/dL (ref 6–23)
CO2: 23 mEq/L (ref 19–32)
Calcium: 8.6 mg/dL (ref 8.4–10.5)
Chloride: 104 mEq/L (ref 96–112)
Creat: 0.9 mg/dL (ref 0.50–1.10)
GFR, EST NON AFRICAN AMERICAN: 77 mL/min
GFR, Est African American: 89 mL/min
Glucose, Bld: 117 mg/dL — ABNORMAL HIGH (ref 70–99)
POTASSIUM: 4 meq/L (ref 3.5–5.3)
SODIUM: 138 meq/L (ref 135–145)
TOTAL PROTEIN: 6.3 g/dL (ref 6.0–8.3)
Total Bilirubin: 0.2 mg/dL — ABNORMAL LOW (ref 0.3–1.2)

## 2013-02-17 LAB — T-HELPER CELL (CD4) - (RCID CLINIC ONLY)
CD4 T CELL HELPER: 48 % (ref 33–55)
CD4 T Cell Abs: 1190 /uL (ref 400–2700)

## 2013-02-17 NOTE — Progress Notes (Signed)
Pt stated menses 02/15/13.  Heavy today.  Will reschedule PAP smear appt.

## 2013-02-20 LAB — HIV-1 RNA QUANT-NO REFLEX-BLD
HIV 1 RNA Quant: 819 copies/mL — ABNORMAL HIGH (ref <20)
HIV-1 RNA Quant, Log: 2.91 {Log} — ABNORMAL HIGH (ref <1.30)

## 2013-03-08 ENCOUNTER — Encounter: Payer: Self-pay | Admitting: Internal Medicine

## 2013-03-08 ENCOUNTER — Ambulatory Visit (INDEPENDENT_AMBULATORY_CARE_PROVIDER_SITE_OTHER): Payer: Medicaid Other | Admitting: Internal Medicine

## 2013-03-08 VITALS — BP 128/83 | HR 91 | Temp 97.4°F | Ht 64.0 in | Wt 215.0 lb

## 2013-03-08 DIAGNOSIS — B20 Human immunodeficiency virus [HIV] disease: Secondary | ICD-10-CM

## 2013-03-08 MED ORDER — PROMETHAZINE HCL 12.5 MG PO TABS: 12.5000 mg | ORAL_TABLET | Freq: Every day | ORAL | Status: DC

## 2013-03-08 MED ORDER — ELVITEG-COBIC-EMTRICIT-TENOFDF 150-150-200-300 MG PO TABS: 1.0000 | ORAL_TABLET | Freq: Every day | ORAL | Status: DC

## 2013-03-08 NOTE — Assessment & Plan Note (Signed)
Will see if she starts and check labs in 3-4 weeks.  RTC after labs.  Also with nausea so will start Phenergan

## 2013-03-08 NOTE — Progress Notes (Signed)
  Subjective:    Patient ID: Stacy RancherLinda N [name], female    DOB: Oct 13, 1965, 48 y.o.   MRN: [id number]  HPI  She comes in for routine follow up.  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  I saw her in October and she was interested in starting. She got labs in January but never started the Stribild.  She tells me she is going to go pick them up today.   She feels well medcially, though continues to be depressed.  She takes her psychiatry meds daily with no missed doses.  No weight loss, no diarrhea.     Review of Systems  Constitutional: Negative for fever, appetite change and fatigue.  HENT: Negative for sore throat and trouble swallowing.   Eyes: Negative for visual disturbance.  Respiratory: Negative for shortness of breath.   Cardiovascular: Negative for leg swelling.  Gastrointestinal: Negative for nausea, abdominal pain and diarrhea.  Musculoskeletal: Negative for arthralgias and myalgias.  Skin: Negative for rash.  Neurological: Negative for dizziness, light-headedness and headaches.  Hematological: Negative for adenopathy.  Psychiatric/Behavioral: Positive for dysphoric mood. Negative for suicidal ideas and sleep disturbance. The patient is not nervous/anxious.        Objective:   Physical Exam  Constitutional: She appears well-developed and well-nourished. No distress.  HENT:  Mouth/Throat: No oropharyngeal exudate.  Eyes: No scleral icterus.  Cardiovascular: Normal rate, regular rhythm and normal heart sounds.   No murmur heard. Pulmonary/Chest: Effort normal and breath sounds normal. No respiratory distress.  Lymphadenopathy:    She has no cervical adenopathy.  Neurological: She is alert.  Skin: No rash noted.          Assessment & Plan:

## 2013-03-17 ENCOUNTER — Ambulatory Visit: Payer: Self-pay

## 2013-03-17 ENCOUNTER — Telehealth: Payer: Self-pay | Admitting: *Deleted

## 2013-03-17 NOTE — Telephone Encounter (Signed)
New appt made for PAP smear.

## 2013-03-31 ENCOUNTER — Ambulatory Visit: Payer: Self-pay

## 2013-04-05 ENCOUNTER — Other Ambulatory Visit: Payer: Self-pay

## 2013-05-11 ENCOUNTER — Ambulatory Visit: Payer: Self-pay | Admitting: Internal Medicine

## 2013-05-11 ENCOUNTER — Telehealth: Payer: Self-pay | Admitting: *Deleted

## 2013-05-11 NOTE — Telephone Encounter (Signed)
Patient forgot about today's appointment.  Rescheduled for 4/28.  Pt will arrange transportation. Andree CossMichelle M Margarethe Virgen, RN

## 2013-05-22 ENCOUNTER — Encounter (HOSPITAL_COMMUNITY): Payer: Self-pay | Admitting: Emergency Medicine

## 2013-05-22 ENCOUNTER — Emergency Department (HOSPITAL_COMMUNITY)
Admission: EM | Admit: 2013-05-22 | Discharge: 2013-05-22 | Disposition: A | Discharge status: Home / Self Care | Payer: Medicaid Other | Attending: Emergency Medicine | Admitting: Emergency Medicine

## 2013-05-22 DIAGNOSIS — M545 Low back pain, unspecified: Secondary | ICD-10-CM | POA: Diagnosis present

## 2013-05-22 DIAGNOSIS — F319 Bipolar disorder, unspecified: Secondary | ICD-10-CM | POA: Diagnosis not present

## 2013-05-22 DIAGNOSIS — I10 Essential (primary) hypertension: Secondary | ICD-10-CM | POA: Diagnosis not present

## 2013-05-22 DIAGNOSIS — Z87820 Personal history of traumatic brain injury: Secondary | ICD-10-CM | POA: Diagnosis not present

## 2013-05-22 DIAGNOSIS — F259 Schizoaffective disorder, unspecified: Secondary | ICD-10-CM | POA: Insufficient documentation

## 2013-05-22 DIAGNOSIS — Z79899 Other long term (current) drug therapy: Secondary | ICD-10-CM | POA: Insufficient documentation

## 2013-05-22 DIAGNOSIS — Z21 Asymptomatic human immunodeficiency virus [HIV] infection status: Secondary | ICD-10-CM | POA: Insufficient documentation

## 2013-05-22 DIAGNOSIS — F17200 Nicotine dependence, unspecified, uncomplicated: Secondary | ICD-10-CM | POA: Diagnosis not present

## 2013-05-22 DIAGNOSIS — Z87828 Personal history of other (healed) physical injury and trauma: Secondary | ICD-10-CM | POA: Insufficient documentation

## 2013-05-22 DIAGNOSIS — N390 Urinary tract infection, site not specified: Secondary | ICD-10-CM | POA: Insufficient documentation

## 2013-05-22 DIAGNOSIS — Z3202 Encounter for pregnancy test, result negative: Secondary | ICD-10-CM | POA: Insufficient documentation

## 2013-05-22 LAB — URINALYSIS, ROUTINE W REFLEX MICROSCOPIC
Bilirubin Urine: NEGATIVE
GLUCOSE, UA: NEGATIVE mg/dL
Ketones, ur: NEGATIVE mg/dL
Leukocytes, UA: NEGATIVE
Nitrite: NEGATIVE
PH: 8 (ref 5.0–8.0)
Protein, ur: NEGATIVE mg/dL
SPECIFIC GRAVITY, URINE: 1.023 (ref 1.005–1.030)
Urobilinogen, UA: 0.2 mg/dL (ref 0.0–1.0)

## 2013-05-22 LAB — PREGNANCY, URINE: PREG TEST UR: NEGATIVE

## 2013-05-22 LAB — URINE MICROSCOPIC-ADD ON

## 2013-05-22 LAB — POC URINE PREG, ED: Preg Test, Ur: NEGATIVE

## 2013-05-22 MED ORDER — CIPROFLOXACIN HCL 500 MG PO TABS: 500.0000 mg | ORAL_TABLET | Freq: Two times a day (BID) | ORAL | Status: DC

## 2013-05-22 MED ORDER — ACETAMINOPHEN 325 MG PO TABS: 650.0000 mg | ORAL_TABLET | Freq: Once | ORAL | Status: DC

## 2013-05-22 MED ORDER — DIPHENHYDRAMINE HCL 25 MG PO CAPS
25.0000 mg | ORAL_CAPSULE | Freq: Once | ORAL | Status: AC
  Administered 2013-05-22: 25 mg via ORAL
  Filled 2013-05-22: qty 1

## 2013-05-22 MED ORDER — KETOROLAC TROMETHAMINE 30 MG/ML IJ SOLN
30.0000 mg | Freq: Once | INTRAMUSCULAR | Status: AC
  Administered 2013-05-22: 30 mg via INTRAMUSCULAR
  Filled 2013-05-22: qty 1

## 2013-05-22 NOTE — Discharge Instructions (Signed)
Please follow up with your primary care physician in 1-2 days. If you do not have one please [facility] number listed above. Please take your antibiotic until completion. Please alternate between Motrin and Tylenol every three hours for fevers and pain. Please read all discharge instructions and return precautions.   Urinary Tract Infection Urinary tract infections (UTIs) can develop anywhere along your urinary tract. Your urinary tract is your body's drainage system for removing wastes and extra water. Your urinary tract includes two kidneys, two ureters, a bladder, and a urethra. Your kidneys are a pair of bean-shaped organs. Each kidney is about the size of your fist. They are located below your ribs, one on each side of your spine. CAUSES Infections are caused by microbes, which are microscopic organisms, including fungi, viruses, and bacteria. These organisms are so small that they can only be seen through a microscope. Bacteria are the microbes that most commonly cause UTIs. SYMPTOMS  Symptoms of UTIs may vary by age and gender of the patient and by the location of the infection. Symptoms in young women typically include a frequent and intense urge to urinate and a painful, burning feeling in the bladder or urethra during urination. Older women and men are more likely to be tired, shaky, and weak and have muscle aches and abdominal pain. A fever may mean the infection is in your kidneys. Other symptoms of a kidney infection include pain in your back or sides below the ribs, nausea, and vomiting. DIAGNOSIS To diagnose a UTI, your caregiver will ask you about your symptoms. Your caregiver also will ask to provide a urine sample. The urine sample will be tested for bacteria and white blood cells. White blood cells are made by your body to help fight infection. TREATMENT  Typically, UTIs can be treated with medication. Because most UTIs are caused by a bacterial infection,  they usually can be treated with the use of antibiotics. The choice of antibiotic and length of treatment depend on your symptoms and the type of bacteria causing your infection. HOME CARE INSTRUCTIONS  If you were prescribed antibiotics, take them exactly as your caregiver instructs you. Finish the medication even if you feel better after you have only taken some of the medication.  Drink enough water and fluids to keep your urine clear or pale yellow.  Avoid caffeine, tea, and carbonated beverages. They tend to irritate your bladder.  Empty your bladder often. Avoid holding urine for long periods of time.  Empty your bladder before and after sexual intercourse.  After a bowel movement, women should cleanse from front to back. Use each tissue only once. SEEK MEDICAL CARE IF:   You have back pain.  You develop a fever.  Your symptoms do not begin to resolve within 3 days. SEEK IMMEDIATE MEDICAL CARE IF:   You have severe back pain or lower abdominal pain.  You develop chills.  You have nausea or vomiting.  You have continued burning or discomfort with urination. MAKE SURE YOU:   Understand these instructions.  Will watch your condition.  Will get help right away if you are not doing well or get worse. Document Released: 10/22/2004 Document Revised: 07/14/2011 Document Reviewed: 02/20/2011 [employer] Patient Information 2014 [employer] [place].

## 2013-05-22 NOTE — ED Provider Notes (Signed)
CSN: [id number]     Arrival date & time 05/22/13  1025 History   First MD Initiated Contact with Patient 05/22/13 1129     Chief Complaint  Patient presents with  . Back Pain     (Consider location/radiation/quality/duration/timing/severity/associated sxs/prior Treatment) HPI Comments: Patient is a 48 yo F PMHx significant for bipolar disorder, schizoaffective disorder, HTN, TBI, HIV, substance abuse presenting to the ED for suprapubic discomfort with low back discomfort with associated dysuria x1 week. During my evaluation the patient she denies any alleviating or aggravating factors. Attempted to take an aspirin for pain. Despite triage note patient denies any constipation, diarrhea, painful BM's, nausea, vomiting, flank pain, vaginal discharge. Abdominal surgical history includes bowel obstruction repair. LMP ended yesterday.   Patient is a 48 y.o. female presenting with back pain.  Back Pain Associated symptoms: abdominal pain and dysuria   Associated symptoms: no fever     Past Medical History  Diagnosis Date  . Bipolar disorder   . Schizoaffective disorder   . Suicide and self-inflicted injury   . Hypertension   . Traumatic brain injury   . Substance abuse   . HIV (human immunodeficiency virus infection)    Past Surgical History  Procedure Laterality Date  . 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 Bowel obstruction    . Surgery r/t bowel obstruction     History reviewed. No pertinent family history. History  Substance Use Topics  . Smoking status: Current Every Day Smoker -- 1.00 packs/day for 30 years    Types: Cigarettes  . Smokeless tobacco: Never Used     Comment: trying to cut back  . Alcohol Use: No     Comment: 2-3 beers   OB History   Grav Para Term Preterm Abortions TAB SAB Ect Mult Living                 Review of Systems  Constitutional: Negative for fever and chills.  Gastrointestinal: Positive for abdominal pain. Negative for nausea, vomiting, diarrhea,  constipation, blood in stool, abdominal distention, anal bleeding and rectal pain.  Genitourinary: Positive for dysuria, urgency and frequency.  Musculoskeletal: Positive for back pain.  All other systems reviewed and are negative.     Allergies  Ibuprofen; Lithium; Risperidone and related; and Thorazine  Home Medications   Prior to Admission medications   Medication Sig Start Date End Date Taking? Authorizing Provider  elvitegravir-cobicistat-emtricitabine-tenofovir (STRIBILD) 150-150-200-300 MG TABS tablet Take 1 tablet by mouth daily. 03/08/13  Yes Gardiner Barefootobert W Comer, MD  FLUoxetine (PROZAC) 20 MG capsule Take 1 capsule (20 mg total) by mouth daily. For depression. 02/02/12  Yes Verne SpurrNeil Mashburn, PA-C  promethazine (PHENERGAN) 12.5 MG tablet Take 1 tablet (12.5 mg total) by mouth daily. 03/08/13  Yes Gardiner Barefootobert W Comer, MD  QUEtiapine (SEROQUEL) 100 MG tablet Take 100-200 mg by mouth 4 (four) times daily. 1 tab tid and 2 tabs qhs   Yes Historical Provider, MD  zolpidem (AMBIEN) 5 MG tablet Take 5 mg by mouth at bedtime as needed for sleep.   Yes Historical Provider, MD   BP 119/76  Pulse 82  Temp(Src) 98.3 F (36.8 C) (Oral)  Resp 16  Ht 5\' 4"  (1.626 m)  Wt 215 lb (97.523 kg)  BMI 36.89 kg/m2  SpO2 100%  LMP 05/19/2013 Physical Exam  Nursing note and vitals reviewed. Constitutional: She is oriented to person, place, and time. She appears well-developed and well-nourished. No distress.  HENT:  Head: Normocephalic and atraumatic.  Right Ear: External ear normal.  Left Ear: External ear normal.  Eyes: Conjunctivae are normal.  Neck: Normal range of motion. Neck supple.  Cardiovascular: Normal rate, regular rhythm and normal heart sounds.   Pulmonary/Chest: Effort normal and breath sounds normal. No respiratory distress.  Abdominal: Soft. Bowel sounds are normal. She exhibits no distension. There is tenderness in the suprapubic area. There is no rigidity, no rebound, no guarding and no CVA  tenderness.  Musculoskeletal: Normal range of motion.       Lumbar back: She exhibits tenderness. She exhibits normal range of motion, no bony tenderness and no deformity.       Back:  Neurological: She is alert and oriented to person, place, and time.  Skin: Skin is warm and dry. She is not diaphoretic.    ED Course  Procedures (including critical care time) Medications  acetaminophen (TYLENOL) tablet 650 mg (650 mg Oral Not Given 05/22/13 1304)  ketorolac (TORADOL) 30 MG/ML injection 30 mg (30 mg Intramuscular Given 05/22/13 1301)  diphenhydrAMINE (BENADRYL) capsule 25 mg (25 mg Oral Given 05/22/13 1301)    Labs Review Labs Reviewed  URINALYSIS, ROUTINE W REFLEX MICROSCOPIC - Abnormal; Notable for the following:    APPearance HAZY (*)    Hgb urine dipstick LARGE (*)    All other components within normal limits  URINE MICROSCOPIC-ADD ON - Abnormal; Notable for the following:    Squamous Epithelial / LPF FEW (*)    Bacteria, UA FEW (*)    All other components within normal limits  PREGNANCY, URINE  POC URINE PREG, ED    Imaging Review No results found.   EKG Interpretation None      MDM   Final diagnoses:  UTI (lower urinary tract infection)    Filed Vitals:   05/22/13 1308  BP: 119/76  Pulse: 82  Temp:   Resp: 16   Afebrile, NAD, non-toxic appearing, AAOx4. Pt has been diagnosed with a UTI. Pt is afebrile, no CVA tenderness, abdomen soft, mildly tender in suprapubic region, no rigidity, guarding or rebound, normotensive, and denies N/V. Pt to be dc home with antibiotics and instructions to follow up with PCP if symptoms persist. Patient is stable at time of discharge       Jeannetta EllisJennifer L Zylpha Poynor, PA-C 05/22/13 1532

## 2013-05-22 NOTE — ED Notes (Signed)
Pt presents with lower back pain, burning with urination, painful BM's, and increase pain when laying flat x3 days. Pt states she "took a little drink" last week and then her pain came on

## 2013-05-23 ENCOUNTER — Encounter: Payer: Self-pay | Admitting: Internal Medicine

## 2013-05-23 ENCOUNTER — Ambulatory Visit (INDEPENDENT_AMBULATORY_CARE_PROVIDER_SITE_OTHER): Payer: Medicaid Other | Admitting: Internal Medicine

## 2013-05-23 VITALS — BP 126/84 | HR 93 | Temp 97.5°F | Ht 64.0 in | Wt 213.0 lb

## 2013-05-23 DIAGNOSIS — B20 Human immunodeficiency virus [HIV] disease: Secondary | ICD-10-CM

## 2013-05-23 NOTE — Assessment & Plan Note (Signed)
Now on meds.  Labs in 2 weeks.  rtc 3 weeks. Counseled on taking with food.

## 2013-05-23 NOTE — ED Provider Notes (Signed)
Medical screening examination/treatment/procedure(s) were performed by non-physician practitioner and as supervising physician I was immediately available for consultation/collaboration.   EKG Interpretation None        Maryssa Giampietro H Hunter Pinkard, MD 05/23/13 0659 

## 2013-05-23 NOTE — Progress Notes (Signed)
  Subjective:    Patient ID: [name], female    DOB: 02-20-1965, 48 y.o.   MRN: [id number]  HPI  She comes in for routine follow up.  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  I saw her in February and she was interested in starting Stribild.  No weight loss, no diarrhea.  She started 2 days ago.  Does have some nausea with it and has taken on empty stomach.    Review of Systems  Constitutional: Negative for fever, appetite change and fatigue.  HENT: Negative for sore throat and trouble swallowing.   Eyes: Negative for visual disturbance.  Respiratory: Negative for shortness of breath.   Cardiovascular: Negative for leg swelling.  Gastrointestinal: Negative for nausea, abdominal pain and diarrhea.  Musculoskeletal: Negative for arthralgias and myalgias.  Skin: Negative for rash.  Neurological: Negative for dizziness, light-headedness and headaches.  Hematological: Negative for adenopathy.  Psychiatric/Behavioral: Positive for dysphoric mood. Negative for suicidal ideas and sleep disturbance. The patient is not nervous/anxious.        Objective:   Physical Exam  Constitutional: She appears well-developed and well-nourished. No distress.  HENT:  Mouth/Throat: No oropharyngeal exudate.  Eyes: No scleral icterus.  Cardiovascular: Normal rate, regular rhythm and normal heart sounds.   No murmur heard. Pulmonary/Chest: Effort normal and breath sounds normal. No respiratory distress.  Lymphadenopathy:    She has no cervical adenopathy.  Neurological: She is alert.  Skin: No rash noted.          Assessment & Plan:

## 2013-06-09 ENCOUNTER — Other Ambulatory Visit: Payer: Medicaid Other

## 2013-06-09 ENCOUNTER — Other Ambulatory Visit (HOSPITAL_COMMUNITY)
Admission: RE | Admit: 2013-06-09 | Discharge: 2013-06-09 | Disposition: A | Discharge status: Home / Self Care | Payer: Medicaid Other | Source: Ambulatory Visit | Attending: Internal Medicine | Admitting: Internal Medicine

## 2013-06-09 ENCOUNTER — Ambulatory Visit (INDEPENDENT_AMBULATORY_CARE_PROVIDER_SITE_OTHER): Payer: Medicaid Other | Admitting: *Deleted

## 2013-06-09 DIAGNOSIS — N760 Acute vaginitis: Secondary | ICD-10-CM | POA: Diagnosis present

## 2013-06-09 DIAGNOSIS — B20 Human immunodeficiency virus [HIV] disease: Secondary | ICD-10-CM

## 2013-06-09 DIAGNOSIS — Z113 Encounter for screening for infections with a predominantly sexual mode of transmission: Secondary | ICD-10-CM | POA: Diagnosis present

## 2013-06-09 DIAGNOSIS — Z124 Encounter for screening for malignant neoplasm of cervix: Secondary | ICD-10-CM

## 2013-06-09 DIAGNOSIS — Z01419 Encounter for gynecological examination (general) (routine) without abnormal findings: Secondary | ICD-10-CM | POA: Insufficient documentation

## 2013-06-09 DIAGNOSIS — Z1231 Encounter for screening mammogram for malignant neoplasm of breast: Secondary | ICD-10-CM

## 2013-06-09 LAB — COMPLETE METABOLIC PANEL WITH GFR
ALBUMIN: 4.1 g/dL (ref 3.5–5.2)
ALT: 19 U/L (ref 0–35)
AST: 22 U/L (ref 0–37)
Alkaline Phosphatase: 74 U/L (ref 39–117)
BUN: 8 mg/dL (ref 6–23)
CHLORIDE: 106 meq/L (ref 96–112)
CO2: 23 mEq/L (ref 19–32)
Calcium: 9.2 mg/dL (ref 8.4–10.5)
Creat: 1.03 mg/dL (ref 0.50–1.10)
GFR, EST NON AFRICAN AMERICAN: 66 mL/min
GFR, Est African American: 76 mL/min
GLUCOSE: 79 mg/dL (ref 70–99)
POTASSIUM: 4.1 meq/L (ref 3.5–5.3)
Sodium: 137 mEq/L (ref 135–145)
TOTAL PROTEIN: 7.1 g/dL (ref 6.0–8.3)
Total Bilirubin: 0.4 mg/dL (ref 0.2–1.2)

## 2013-06-09 LAB — CBC WITH DIFFERENTIAL/PLATELET
BASOS ABS: 0 10*3/uL (ref 0.0–0.1)
BASOS PCT: 0 % (ref 0–1)
EOS PCT: 4 % (ref 0–5)
Eosinophils Absolute: 0.3 10*3/uL (ref 0.0–0.7)
HCT: 36.6 % (ref 36.0–46.0)
Hemoglobin: 12.4 g/dL (ref 12.0–15.0)
Lymphocytes Relative: 38 % (ref 12–46)
Lymphs Abs: 2.5 10*3/uL (ref 0.7–4.0)
MCH: 26.3 pg (ref 26.0–34.0)
MCHC: 33.9 g/dL (ref 30.0–36.0)
MCV: 77.7 fL — AB (ref 78.0–100.0)
MONO ABS: 0.6 10*3/uL (ref 0.1–1.0)
Monocytes Relative: 9 % (ref 3–12)
NEUTROS ABS: 3.2 10*3/uL (ref 1.7–7.7)
Neutrophils Relative %: 49 % (ref 43–77)
Platelets: 316 10*3/uL (ref 150–400)
RBC: 4.71 MIL/uL (ref 3.87–5.11)
RDW: 14.9 % (ref 11.5–15.5)
WBC: 6.5 10*3/uL (ref 4.0–10.5)

## 2013-06-09 LAB — T-HELPER CELL (CD4) - (RCID CLINIC ONLY)
CD4 % Helper T Cell: 45 % (ref 33–55)
CD4 T Cell Abs: 1230 /uL (ref 400–2700)

## 2013-06-09 NOTE — Progress Notes (Signed)
  Subjective:     [name] is a 48 y.o. woman who comes in today for a  pap smear only.  Previous abnormal Pap smears: no. Contraception: Condoms.  Recently had a condom to break during intercourse.  Requesting testing for vaginal infections.  Objective:    LMP 05/19/2013 Pelvic Exam:  Pap smear obtained.   Assessment:    Screening pap smear.   Plan:    Follow up in one year, or as indicated by Pap results.  Pt given educational materials re: HIV and women, self-esteem, BSE, nutrition and diet management, PAP smears and partner safety.  Pt given condoms.

## 2013-06-12 LAB — HIV-1 RNA QUANT-NO REFLEX-BLD
HIV 1 RNA Quant: 2958 copies/mL — ABNORMAL HIGH (ref <20)
HIV-1 RNA Quant, Log: 3.47 {Log} — ABNORMAL HIGH (ref <1.30)

## 2013-06-13 ENCOUNTER — Telehealth: Payer: Self-pay | Admitting: *Deleted

## 2013-06-13 NOTE — Telephone Encounter (Signed)
Pt would like to know her PAP results.  Please contact her at the number listed. Andree CossMichelle M Arjen Deringer, RN

## 2013-06-14 ENCOUNTER — Encounter: Payer: Self-pay | Admitting: *Deleted

## 2013-06-27 ENCOUNTER — Ambulatory Visit (INDEPENDENT_AMBULATORY_CARE_PROVIDER_SITE_OTHER): Payer: Medicaid Other | Admitting: Internal Medicine

## 2013-06-27 ENCOUNTER — Encounter: Payer: Self-pay | Admitting: Internal Medicine

## 2013-06-27 VITALS — BP 110/78 | HR 98 | Temp 98.1°F | Ht 64.0 in | Wt 217.0 lb

## 2013-06-27 DIAGNOSIS — B20 Human immunodeficiency virus [HIV] disease: Secondary | ICD-10-CM

## 2013-06-27 DIAGNOSIS — Z79899 Other long term (current) drug therapy: Secondary | ICD-10-CM

## 2013-06-27 DIAGNOSIS — Z113 Encounter for screening for infections with a predominantly sexual mode of transmission: Secondary | ICD-10-CM | POA: Insufficient documentation

## 2013-06-27 NOTE — Assessment & Plan Note (Signed)
Discussed need for ARVs, patient does not feel ready to take.  Will defer until ready rather than stop/start.  RTC 6 months.

## 2013-06-27 NOTE — Progress Notes (Signed)
  Subjective:    Patient ID: [name], female    DOB: 27-Jun-1965, 48 y.o.   MRN: [id number]  HPI  She comes in for routine follow up.  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  I saw her in February and she was interested in starting Stribild.  No weight loss, no diarrhea.  She started but then decided she did not want to take it anymore and stopped.  Her CD4 is 1200 and viral load 2900.  No new issues.    Review of Systems  Constitutional: Negative for fever, appetite change and fatigue.  HENT: Negative for sore throat and trouble swallowing.   Eyes: Negative for visual disturbance.  Respiratory: Negative for shortness of breath.   Cardiovascular: Negative for leg swelling.  Gastrointestinal: Negative for nausea, abdominal pain and diarrhea.  Musculoskeletal: Negative for arthralgias and myalgias.  Skin: Negative for rash.  Neurological: Negative for dizziness, light-headedness and headaches.  Hematological: Negative for adenopathy.  Psychiatric/Behavioral: Positive for dysphoric mood. Negative for suicidal ideas and sleep disturbance. The patient is not nervous/anxious.        Objective:   Physical Exam  Constitutional: She appears well-developed and well-nourished. No distress.  HENT:  Mouth/Throat: No oropharyngeal exudate.  Eyes: No scleral icterus.  Cardiovascular: Normal rate, regular rhythm and normal heart sounds.   No murmur heard. Pulmonary/Chest: Effort normal and breath sounds normal. No respiratory distress.  Lymphadenopathy:    She has no cervical adenopathy.  Neurological: She is alert.  Skin: No rash noted.          Assessment & Plan:

## 2013-07-05 ENCOUNTER — Ambulatory Visit (HOSPITAL_COMMUNITY)
Admission: RE | Admit: 2013-07-05 | Discharge: 2013-07-05 | Disposition: A | Discharge status: Home / Self Care | Payer: Medicaid Other | Source: Ambulatory Visit | Attending: Internal Medicine | Admitting: Internal Medicine

## 2013-07-05 ENCOUNTER — Encounter (INDEPENDENT_AMBULATORY_CARE_PROVIDER_SITE_OTHER): Payer: Self-pay

## 2013-07-05 DIAGNOSIS — Z1231 Encounter for screening mammogram for malignant neoplasm of breast: Secondary | ICD-10-CM | POA: Diagnosis present

## 2013-07-07 ENCOUNTER — Other Ambulatory Visit: Payer: Self-pay | Admitting: Internal Medicine

## 2013-07-07 DIAGNOSIS — R928 Other abnormal and inconclusive findings on diagnostic imaging of breast: Secondary | ICD-10-CM

## 2013-07-18 ENCOUNTER — Ambulatory Visit
Admission: RE | Admit: 2013-07-18 | Discharge: 2013-07-18 | Disposition: A | Discharge status: Home / Self Care | Payer: Medicaid Other | Source: Ambulatory Visit | Attending: Internal Medicine | Admitting: Internal Medicine

## 2013-07-18 DIAGNOSIS — R928 Other abnormal and inconclusive findings on diagnostic imaging of breast: Secondary | ICD-10-CM

## 2013-12-14 ENCOUNTER — Other Ambulatory Visit (HOSPITAL_COMMUNITY)
Admission: RE | Admit: 2013-12-14 | Discharge: 2013-12-14 | Disposition: A | Discharge status: Home / Self Care | Payer: Medicaid Other | Source: Ambulatory Visit | Attending: Internal Medicine | Admitting: Internal Medicine

## 2013-12-14 ENCOUNTER — Other Ambulatory Visit: Payer: Medicaid Other

## 2013-12-14 DIAGNOSIS — Z113 Encounter for screening for infections with a predominantly sexual mode of transmission: Secondary | ICD-10-CM

## 2013-12-14 DIAGNOSIS — B20 Human immunodeficiency virus [HIV] disease: Secondary | ICD-10-CM

## 2013-12-14 DIAGNOSIS — Z79899 Other long term (current) drug therapy: Secondary | ICD-10-CM

## 2013-12-14 LAB — COMPLETE METABOLIC PANEL WITH GFR
ALT: 15 U/L (ref 0–35)
AST: 13 U/L (ref 0–37)
Albumin: 3.9 g/dL (ref 3.5–5.2)
Alkaline Phosphatase: 90 U/L (ref 39–117)
BILIRUBIN TOTAL: 0.3 mg/dL (ref 0.2–1.2)
BUN: 11 mg/dL (ref 6–23)
CHLORIDE: 103 meq/L (ref 96–112)
CO2: 20 meq/L (ref 19–32)
CREATININE: 0.77 mg/dL (ref 0.50–1.10)
Calcium: 9.1 mg/dL (ref 8.4–10.5)
GLUCOSE: 125 mg/dL — AB (ref 70–99)
Potassium: 4.2 mEq/L (ref 3.5–5.3)
Sodium: 136 mEq/L (ref 135–145)
Total Protein: 7 g/dL (ref 6.0–8.3)

## 2013-12-14 LAB — LIPID PANEL
Cholesterol: 178 mg/dL (ref 0–200)
HDL: 53 mg/dL (ref >39)
LDL CALC: 112 mg/dL — AB (ref 0–99)
TRIGLYCERIDES: 66 mg/dL (ref <150)
Total CHOL/HDL Ratio: 3.4 Ratio
VLDL: 13 mg/dL (ref 0–40)

## 2013-12-14 LAB — CBC WITH DIFFERENTIAL/PLATELET
Basophils Absolute: 0.1 10*3/uL (ref 0.0–0.1)
Basophils Relative: 1 % (ref 0–1)
EOS ABS: 0.1 10*3/uL (ref 0.0–0.7)
Eosinophils Relative: 2 % (ref 0–5)
HEMATOCRIT: 37.4 % (ref 36.0–46.0)
HEMOGLOBIN: 12.8 g/dL (ref 12.0–15.0)
LYMPHS ABS: 2.8 10*3/uL (ref 0.7–4.0)
LYMPHS PCT: 46 % (ref 12–46)
MCH: 26.8 pg (ref 26.0–34.0)
MCHC: 34.2 g/dL (ref 30.0–36.0)
MCV: 78.4 fL (ref 78.0–100.0)
MONO ABS: 0.4 10*3/uL (ref 0.1–1.0)
MONOS PCT: 7 % (ref 3–12)
MPV: 10.1 fL (ref 9.4–12.4)
Neutro Abs: 2.7 10*3/uL (ref 1.7–7.7)
Neutrophils Relative %: 44 % (ref 43–77)
Platelets: 327 10*3/uL (ref 150–400)
RBC: 4.77 MIL/uL (ref 3.87–5.11)
RDW: 15.6 % — ABNORMAL HIGH (ref 11.5–15.5)
WBC: 6.1 10*3/uL (ref 4.0–10.5)

## 2013-12-14 LAB — RPR

## 2013-12-15 LAB — HIV-1 RNA QUANT-NO REFLEX-BLD
HIV 1 RNA QUANT: 1031 {copies}/mL — AB (ref <20)
HIV-1 RNA Quant, Log: 3.01 {Log} — ABNORMAL HIGH (ref <1.30)

## 2013-12-15 LAB — T-HELPER CELL (CD4) - (RCID CLINIC ONLY)
CD4 % Helper T Cell: 42 % (ref 33–55)
CD4 T CELL ABS: 1260 /uL (ref 400–2700)

## 2013-12-15 LAB — URINE CYTOLOGY ANCILLARY ONLY
Chlamydia: NEGATIVE
Neisseria Gonorrhea: NEGATIVE

## 2013-12-28 ENCOUNTER — Ambulatory Visit (INDEPENDENT_AMBULATORY_CARE_PROVIDER_SITE_OTHER): Payer: Medicaid Other | Admitting: Internal Medicine

## 2013-12-28 ENCOUNTER — Encounter: Payer: Self-pay | Admitting: Internal Medicine

## 2013-12-28 VITALS — BP 127/88 | HR 96 | Temp 98.0°F | Ht 64.0 in | Wt 231.0 lb

## 2013-12-28 DIAGNOSIS — B20 Human immunodeficiency virus [HIV] disease: Secondary | ICD-10-CM

## 2013-12-28 NOTE — Progress Notes (Signed)
  Subjective:    Patient ID: [name], female    DOB: 09/15/1965, 48 y.o.   MRN: [id number]  HPI She comes in for routine follow up.  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 She briefly start STribild but stopped last year after not wanting to take it anymore.  No weight loss, no diarrhea.   Her CD4 is 1260 and viral load 1031.  No new issues. Going to start going to [facility]   Review of Systems  Constitutional: Negative for fever, appetite change and fatigue.  HENT: Negative for sore throat and trouble swallowing.   Eyes: Negative for visual disturbance.  Respiratory: Negative for shortness of breath.   Cardiovascular: Negative for leg swelling.  Gastrointestinal: Negative for nausea, abdominal pain and diarrhea.  Musculoskeletal: Negative for myalgias and arthralgias.  Skin: Negative for rash.  Neurological: Negative for dizziness, light-headedness and headaches.  Hematological: Negative for adenopathy.  Psychiatric/Behavioral: Positive for dysphoric mood. Negative for suicidal ideas and sleep disturbance. The patient is not nervous/anxious.        Objective:   Physical Exam  Constitutional: She appears well-developed and well-nourished. No distress.  HENT:  Mouth/Throat: No oropharyngeal exudate.  Eyes: No scleral icterus.  Cardiovascular: Normal rate, regular rhythm and normal heart sounds.   No murmur heard. Pulmonary/Chest: Effort normal and breath sounds normal. No respiratory distress. She has no wheezes.  Lymphadenopathy:    She has no cervical adenopathy.  Skin: No rash noted.          Assessment & Plan:

## 2013-12-28 NOTE — Assessment & Plan Note (Signed)
Still not interested in treatment.  Will continue with every 6 month follow up.

## 2014-01-16 ENCOUNTER — Emergency Department (HOSPITAL_COMMUNITY)
Admission: EM | Admit: 2014-01-16 | Discharge: 2014-01-16 | Disposition: A | Discharge status: Home / Self Care | Payer: Medicaid Other | Attending: Emergency Medicine | Admitting: Emergency Medicine

## 2014-01-16 ENCOUNTER — Encounter (HOSPITAL_COMMUNITY): Payer: Self-pay | Admitting: Emergency Medicine

## 2014-01-16 DIAGNOSIS — Z21 Asymptomatic human immunodeficiency virus [HIV] infection status: Secondary | ICD-10-CM | POA: Diagnosis not present

## 2014-01-16 DIAGNOSIS — F319 Bipolar disorder, unspecified: Secondary | ICD-10-CM | POA: Diagnosis not present

## 2014-01-16 DIAGNOSIS — Z87820 Personal history of traumatic brain injury: Secondary | ICD-10-CM | POA: Diagnosis not present

## 2014-01-16 DIAGNOSIS — B372 Candidiasis of skin and nail: Secondary | ICD-10-CM

## 2014-01-16 DIAGNOSIS — L02214 Cutaneous abscess of groin: Secondary | ICD-10-CM | POA: Diagnosis present

## 2014-01-16 DIAGNOSIS — F259 Schizoaffective disorder, unspecified: Secondary | ICD-10-CM | POA: Insufficient documentation

## 2014-01-16 DIAGNOSIS — L739 Follicular disorder, unspecified: Secondary | ICD-10-CM

## 2014-01-16 DIAGNOSIS — Z720 Tobacco use: Secondary | ICD-10-CM | POA: Diagnosis not present

## 2014-01-16 DIAGNOSIS — Z79899 Other long term (current) drug therapy: Secondary | ICD-10-CM | POA: Insufficient documentation

## 2014-01-16 DIAGNOSIS — I10 Essential (primary) hypertension: Secondary | ICD-10-CM | POA: Insufficient documentation

## 2014-01-16 MED ORDER — CEPHALEXIN 500 MG PO CAPS: 500.0000 mg | ORAL_CAPSULE | Freq: Four times a day (QID) | ORAL | Status: DC

## 2014-01-16 MED ORDER — CLOTRIMAZOLE 1 % EX CREA: TOPICAL_CREAM | CUTANEOUS | Status: DC

## 2014-01-16 NOTE — ED Notes (Signed)
C/o abscess on pubic area.
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
## 2014-01-16 NOTE — Discharge Instructions (Signed)
Take keflex as directed until gone. Use lotrimin cream as directed until skin infection clears. Refer to attached documents for more information.

## 2014-05-10 ENCOUNTER — Other Ambulatory Visit: Payer: Medicaid Other

## 2014-05-10 DIAGNOSIS — B20 Human immunodeficiency virus [HIV] disease: Secondary | ICD-10-CM

## 2014-05-10 LAB — CBC WITH DIFFERENTIAL/PLATELET
Basophils Absolute: 0 10*3/uL (ref 0.0–0.1)
Basophils Relative: 0 % (ref 0–1)
EOS ABS: 0.2 10*3/uL (ref 0.0–0.7)
EOS PCT: 3 % (ref 0–5)
HCT: 35.9 % — ABNORMAL LOW (ref 36.0–46.0)
Hemoglobin: 11.7 g/dL — ABNORMAL LOW (ref 12.0–15.0)
LYMPHS ABS: 2 10*3/uL (ref 0.7–4.0)
Lymphocytes Relative: 38 % (ref 12–46)
MCH: 26.1 pg (ref 26.0–34.0)
MCHC: 32.6 g/dL (ref 30.0–36.0)
MCV: 80.1 fL (ref 78.0–100.0)
MONOS PCT: 7 % (ref 3–12)
MPV: 10.1 fL (ref 8.6–12.4)
Monocytes Absolute: 0.4 10*3/uL (ref 0.1–1.0)
Neutro Abs: 2.8 10*3/uL (ref 1.7–7.7)
Neutrophils Relative %: 52 % (ref 43–77)
Platelets: 315 10*3/uL (ref 150–400)
RBC: 4.48 MIL/uL (ref 3.87–5.11)
RDW: 15.1 % (ref 11.5–15.5)
WBC: 5.3 10*3/uL (ref 4.0–10.5)

## 2014-05-10 LAB — COMPLETE METABOLIC PANEL WITH GFR
ALK PHOS: 93 U/L (ref 39–117)
ALT: 14 U/L (ref 0–35)
AST: 16 U/L (ref 0–37)
Albumin: 3.5 g/dL (ref 3.5–5.2)
BILIRUBIN TOTAL: 0.3 mg/dL (ref 0.2–1.2)
BUN: 8 mg/dL (ref 6–23)
CO2: 25 mEq/L (ref 19–32)
CREATININE: 0.69 mg/dL (ref 0.50–1.10)
Calcium: 8.5 mg/dL (ref 8.4–10.5)
Chloride: 105 mEq/L (ref 96–112)
Glucose, Bld: 121 mg/dL — ABNORMAL HIGH (ref 70–99)
Potassium: 4.3 mEq/L (ref 3.5–5.3)
SODIUM: 137 meq/L (ref 135–145)
Total Protein: 6.4 g/dL (ref 6.0–8.3)

## 2014-05-10 NOTE — Addendum Note (Signed)
Addended by: Mariea ClontsGREEN, Erik Nessel D on: 05/10/2014 12:06 PM   Modules accepted: Orders

## 2014-05-11 LAB — T-HELPER CELL (CD4) - (RCID CLINIC ONLY)
CD4 % Helper T Cell: 41 % (ref 33–55)
CD4 T CELL ABS: 860 /uL (ref 400–2700)

## 2014-05-11 LAB — HIV-1 RNA QUANT-NO REFLEX-BLD
HIV 1 RNA Quant: 310 copies/mL — ABNORMAL HIGH (ref <20)
HIV-1 RNA QUANT, LOG: 2.49 {Log} — AB (ref <1.30)

## 2014-05-17 ENCOUNTER — Ambulatory Visit: Payer: Self-pay | Admitting: Internal Medicine

## 2014-06-28 ENCOUNTER — Ambulatory Visit: Payer: Self-pay | Admitting: Internal Medicine

## 2014-08-06 ENCOUNTER — Other Ambulatory Visit (HOSPITAL_COMMUNITY)
Admission: RE | Admit: 2014-08-06 | Discharge: 2014-08-06 | Disposition: A | Discharge status: Home / Self Care | Payer: Medicaid Other | Source: Ambulatory Visit | Attending: Internal Medicine | Admitting: Internal Medicine

## 2014-08-06 ENCOUNTER — Other Ambulatory Visit: Payer: Medicaid Other

## 2014-08-06 ENCOUNTER — Other Ambulatory Visit: Payer: Self-pay | Admitting: Internal Medicine

## 2014-08-06 ENCOUNTER — Ambulatory Visit: Payer: Self-pay | Admitting: Internal Medicine

## 2014-08-06 ENCOUNTER — Ambulatory Visit (INDEPENDENT_AMBULATORY_CARE_PROVIDER_SITE_OTHER): Payer: Self-pay | Admitting: *Deleted

## 2014-08-06 ENCOUNTER — Telehealth: Payer: Self-pay | Admitting: *Deleted

## 2014-08-06 DIAGNOSIS — Z01419 Encounter for gynecological examination (general) (routine) without abnormal findings: Secondary | ICD-10-CM | POA: Insufficient documentation

## 2014-08-06 DIAGNOSIS — Z124 Encounter for screening for malignant neoplasm of cervix: Secondary | ICD-10-CM

## 2014-08-06 DIAGNOSIS — N760 Acute vaginitis: Secondary | ICD-10-CM | POA: Insufficient documentation

## 2014-08-06 DIAGNOSIS — Z113 Encounter for screening for infections with a predominantly sexual mode of transmission: Secondary | ICD-10-CM | POA: Insufficient documentation

## 2014-08-06 DIAGNOSIS — B20 Human immunodeficiency virus [HIV] disease: Secondary | ICD-10-CM

## 2014-08-06 NOTE — Patient Instructions (Signed)
Your results will be ready in about a week.  I will either call you or send you a letter.  Thank you for coming to the Center for your care.  Angelique Blonderenise, RN

## 2014-08-06 NOTE — Telephone Encounter (Signed)
brownish vagainal discharge x 4 days.  Time for PAP smear too.  Sent sample for PAP smear and STI testing.

## 2014-08-07 LAB — CERVICOVAGINAL ANCILLARY ONLY
CHLAMYDIA, DNA PROBE: NEGATIVE
NEISSERIA GONORRHEA: NEGATIVE
Trichomonas: NEGATIVE

## 2014-08-07 LAB — HIV-1 RNA QUANT-NO REFLEX-BLD
HIV 1 RNA Quant: 987 copies/mL — ABNORMAL HIGH (ref <20)
HIV-1 RNA Quant, Log: 2.99 {Log} — ABNORMAL HIGH (ref <1.30)

## 2014-08-07 LAB — T-HELPER CELL (CD4) - (RCID CLINIC ONLY)
CD4 % Helper T Cell: 40 % (ref 33–55)
CD4 T Cell Abs: 850 /uL (ref 400–2700)

## 2014-08-07 LAB — CYTOLOGY - PAP

## 2014-08-08 LAB — CERVICOVAGINAL ANCILLARY ONLY: Candida vaginitis: NEGATIVE

## 2014-08-09 ENCOUNTER — Encounter: Payer: Self-pay | Admitting: *Deleted

## 2014-08-09 ENCOUNTER — Other Ambulatory Visit: Payer: Self-pay | Admitting: Internal Medicine

## 2014-08-09 MED ORDER — METRONIDAZOLE 500 MG PO TABS: 500.0000 mg | ORAL_TABLET | Freq: Two times a day (BID) | ORAL | Status: DC

## 2014-08-09 NOTE — Progress Notes (Signed)
Notified pt to pick up rx

## 2014-09-03 ENCOUNTER — Ambulatory Visit: Payer: Self-pay | Admitting: Internal Medicine

## 2014-10-04 ENCOUNTER — Other Ambulatory Visit: Payer: Medicaid Other

## 2014-10-04 DIAGNOSIS — B20 Human immunodeficiency virus [HIV] disease: Secondary | ICD-10-CM

## 2014-10-05 LAB — T-HELPER CELL (CD4) - (RCID CLINIC ONLY)
CD4 % Helper T Cell: 41 % (ref 33–55)
CD4 T CELL ABS: 880 /uL (ref 400–2700)

## 2014-10-07 LAB — HIV-1 RNA QUANT-NO REFLEX-BLD
HIV 1 RNA Quant: 1330 copies/mL — ABNORMAL HIGH (ref <20)
HIV-1 RNA Quant, Log: 3.12 {Log} — ABNORMAL HIGH (ref <1.30)

## 2014-10-09 ENCOUNTER — Encounter: Payer: Self-pay | Admitting: Internal Medicine

## 2014-10-09 ENCOUNTER — Other Ambulatory Visit (HOSPITAL_COMMUNITY)
Admission: RE | Admit: 2014-10-09 | Discharge: 2014-10-09 | Disposition: A | Discharge status: Home / Self Care | Payer: Medicaid Other | Source: Ambulatory Visit | Attending: Internal Medicine | Admitting: Internal Medicine

## 2014-10-09 ENCOUNTER — Ambulatory Visit (INDEPENDENT_AMBULATORY_CARE_PROVIDER_SITE_OTHER): Payer: Medicaid Other | Admitting: Internal Medicine

## 2014-10-09 VITALS — BP 116/73 | HR 80 | Temp 98.2°F | Wt 222.0 lb

## 2014-10-09 DIAGNOSIS — R300 Dysuria: Secondary | ICD-10-CM

## 2014-10-09 DIAGNOSIS — Z79899 Other long term (current) drug therapy: Secondary | ICD-10-CM

## 2014-10-09 DIAGNOSIS — Z113 Encounter for screening for infections with a predominantly sexual mode of transmission: Secondary | ICD-10-CM | POA: Diagnosis not present

## 2014-10-09 DIAGNOSIS — B20 Human immunodeficiency virus [HIV] disease: Secondary | ICD-10-CM

## 2014-10-09 NOTE — Assessment & Plan Note (Signed)
Again encouraged treatment and discussed benefits, concerns with active virus.  Patient continues to refuse.  RTC 6 months.

## 2014-10-09 NOTE — Assessment & Plan Note (Signed)
Will check a UA and call patient if concerns for infection

## 2014-10-09 NOTE — Progress Notes (Signed)
  Subjective:    Patient ID: [name], female    DOB: Jan 15, 1966, 49 y.o.   MRN: [id number]  HPI She comes in for routine follow up.  She has been a long term non progressor and not interested in treatment. She briefly start STribild but stopped last year after not wanting to take it anymore.  No weight loss, no diarrhea.   Her CD4 is 880 and viral load 1330.  No new issues.  Some dysuria today.  Recently treated for vaginal discharge.     Review of Systems  Constitutional: Negative for fever, appetite change and fatigue.  HENT: Negative for sore throat and trouble swallowing.   Respiratory: Negative for shortness of breath.   Cardiovascular: Negative for leg swelling.  Gastrointestinal: Negative for abdominal pain and diarrhea.  Skin: Negative for rash.  Neurological: Negative for dizziness, light-headedness and headaches.  Psychiatric/Behavioral: Negative for dysphoric mood.       Objective:   Physical Exam  Constitutional: She appears well-developed and well-nourished. No distress.  HENT:  Mouth/Throat: No oropharyngeal exudate.  Eyes: No scleral icterus.  Cardiovascular: Normal rate, regular rhythm and normal heart sounds.   No murmur heard. Pulmonary/Chest: Effort normal and breath sounds normal. No respiratory distress.  Lymphadenopathy:    She has no cervical adenopathy.  Skin: No rash noted.          Assessment & Plan:

## 2014-10-09 NOTE — Addendum Note (Signed)
Addended by: Gardiner Barefoot on: 10/09/2014 11:54 AM   Modules accepted: Orders

## 2014-10-10 LAB — URINALYSIS, ROUTINE W REFLEX MICROSCOPIC
Bilirubin Urine: NEGATIVE
Glucose, UA: NEGATIVE
HGB URINE DIPSTICK: NEGATIVE
Ketones, ur: NEGATIVE
LEUKOCYTES UA: NEGATIVE
NITRITE: NEGATIVE
PH: 5.5 (ref 5.0–8.0)
PROTEIN: NEGATIVE
Specific Gravity, Urine: 1.019 (ref 1.001–1.035)

## 2014-10-11 LAB — URINE CYTOLOGY ANCILLARY ONLY
Chlamydia: NEGATIVE
Neisseria Gonorrhea: NEGATIVE

## 2014-10-16 ENCOUNTER — Ambulatory Visit: Payer: Self-pay | Admitting: Internal Medicine

## 2014-11-10 ENCOUNTER — Emergency Department (HOSPITAL_COMMUNITY)
Admission: EM | Admit: 2014-11-10 | Discharge: 2014-11-10 | Disposition: A | Discharge status: Home / Self Care | Payer: Medicaid Other | Attending: Emergency Medicine | Admitting: Emergency Medicine

## 2014-11-10 ENCOUNTER — Encounter (HOSPITAL_COMMUNITY): Payer: Self-pay | Admitting: Emergency Medicine

## 2014-11-10 DIAGNOSIS — Z87828 Personal history of other (healed) physical injury and trauma: Secondary | ICD-10-CM | POA: Insufficient documentation

## 2014-11-10 DIAGNOSIS — Z87820 Personal history of traumatic brain injury: Secondary | ICD-10-CM | POA: Diagnosis not present

## 2014-11-10 DIAGNOSIS — Y92480 Sidewalk as the place of occurrence of the external cause: Secondary | ICD-10-CM | POA: Diagnosis not present

## 2014-11-10 DIAGNOSIS — Y9302 Activity, running: Secondary | ICD-10-CM | POA: Diagnosis not present

## 2014-11-10 DIAGNOSIS — Z21 Asymptomatic human immunodeficiency virus [HIV] infection status: Secondary | ICD-10-CM | POA: Insufficient documentation

## 2014-11-10 DIAGNOSIS — Z79899 Other long term (current) drug therapy: Secondary | ICD-10-CM | POA: Insufficient documentation

## 2014-11-10 DIAGNOSIS — F319 Bipolar disorder, unspecified: Secondary | ICD-10-CM | POA: Insufficient documentation

## 2014-11-10 DIAGNOSIS — Y999 Unspecified external cause status: Secondary | ICD-10-CM | POA: Insufficient documentation

## 2014-11-10 DIAGNOSIS — W101XXA Fall (on)(from) sidewalk curb, initial encounter: Secondary | ICD-10-CM | POA: Insufficient documentation

## 2014-11-10 DIAGNOSIS — S61431A Puncture wound without foreign body of right hand, initial encounter: Secondary | ICD-10-CM | POA: Diagnosis not present

## 2014-11-10 DIAGNOSIS — Z720 Tobacco use: Secondary | ICD-10-CM | POA: Diagnosis not present

## 2014-11-10 DIAGNOSIS — F259 Schizoaffective disorder, unspecified: Secondary | ICD-10-CM | POA: Insufficient documentation

## 2014-11-10 DIAGNOSIS — I10 Essential (primary) hypertension: Secondary | ICD-10-CM | POA: Insufficient documentation

## 2014-11-10 DIAGNOSIS — S6991XA Unspecified injury of right wrist, hand and finger(s), initial encounter: Secondary | ICD-10-CM | POA: Diagnosis present

## 2014-11-10 MED ORDER — TETANUS-DIPHTH-ACELL PERTUSSIS 5-2.5-18.5 LF-MCG/0.5 IM SUSP
0.5000 mL | Freq: Once | INTRAMUSCULAR | Status: AC
  Administered 2014-11-10: 0.5 mL via INTRAMUSCULAR
  Filled 2014-11-10: qty 0.5
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
## 2014-11-10 NOTE — Discharge Instructions (Signed)
Tetanus vaccine was updated today.  This is good for 10 years. Keep hand clean and dry at home, monitor for signs of infection (redness, swelling, drainage,etc) Follow-up with your primary care physician. Return here for new concerns.
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
## 2014-11-10 NOTE — ED Notes (Signed)
Cleansed wound and applied band aid.  Discussed signs and symptoms of infection

## 2014-12-29 ENCOUNTER — Emergency Department (HOSPITAL_COMMUNITY): Payer: No Typology Code available for payment source

## 2014-12-29 ENCOUNTER — Encounter (HOSPITAL_COMMUNITY): Payer: Self-pay | Admitting: Emergency Medicine

## 2014-12-29 ENCOUNTER — Emergency Department (HOSPITAL_COMMUNITY)
Admission: EM | Admit: 2014-12-29 | Discharge: 2014-12-29 | Disposition: A | Discharge status: Home / Self Care | Payer: No Typology Code available for payment source | Attending: Emergency Medicine | Admitting: Emergency Medicine

## 2014-12-29 DIAGNOSIS — Z21 Asymptomatic human immunodeficiency virus [HIV] infection status: Secondary | ICD-10-CM | POA: Insufficient documentation

## 2014-12-29 DIAGNOSIS — S161XXA Strain of muscle, fascia and tendon at neck level, initial encounter: Secondary | ICD-10-CM

## 2014-12-29 DIAGNOSIS — F17210 Nicotine dependence, cigarettes, uncomplicated: Secondary | ICD-10-CM | POA: Insufficient documentation

## 2014-12-29 DIAGNOSIS — S29001A Unspecified injury of muscle and tendon of front wall of thorax, initial encounter: Secondary | ICD-10-CM | POA: Diagnosis not present

## 2014-12-29 DIAGNOSIS — F319 Bipolar disorder, unspecified: Secondary | ICD-10-CM | POA: Insufficient documentation

## 2014-12-29 DIAGNOSIS — Z79899 Other long term (current) drug therapy: Secondary | ICD-10-CM | POA: Insufficient documentation

## 2014-12-29 DIAGNOSIS — S8002XA Contusion of left knee, initial encounter: Secondary | ICD-10-CM | POA: Diagnosis not present

## 2014-12-29 DIAGNOSIS — Y9389 Activity, other specified: Secondary | ICD-10-CM | POA: Diagnosis not present

## 2014-12-29 DIAGNOSIS — Y92410 Unspecified street and highway as the place of occurrence of the external cause: Secondary | ICD-10-CM | POA: Diagnosis not present

## 2014-12-29 DIAGNOSIS — F259 Schizoaffective disorder, unspecified: Secondary | ICD-10-CM | POA: Diagnosis not present

## 2014-12-29 DIAGNOSIS — S0990XA Unspecified injury of head, initial encounter: Secondary | ICD-10-CM | POA: Insufficient documentation

## 2014-12-29 DIAGNOSIS — I10 Essential (primary) hypertension: Secondary | ICD-10-CM | POA: Insufficient documentation

## 2014-12-29 DIAGNOSIS — Y998 Other external cause status: Secondary | ICD-10-CM | POA: Diagnosis not present

## 2014-12-29 DIAGNOSIS — Z87820 Personal history of traumatic brain injury: Secondary | ICD-10-CM | POA: Diagnosis not present

## 2014-12-29 DIAGNOSIS — S8992XA Unspecified injury of left lower leg, initial encounter: Secondary | ICD-10-CM | POA: Diagnosis present

## 2014-12-29 MED ORDER — HYDROCODONE-ACETAMINOPHEN 5-325 MG PO TABS
2.0000 | ORAL_TABLET | Freq: Once | ORAL | Status: AC
  Administered 2014-12-29: 2 via ORAL
  Filled 2014-12-29: qty 2

## 2014-12-29 MED ORDER — HYDROCODONE-ACETAMINOPHEN 5-325 MG PO TABS: 1.0000 | ORAL_TABLET | Freq: Four times a day (QID) | ORAL | Status: DC | PRN
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
## 2014-12-29 NOTE — Discharge Instructions (Signed)
Rest.  Hydrocodone as prescribed as needed for pain.  Follow-up with your primary Dr. if not improving in the next few days, and return to the ER if symptoms significantly worsen or change.   Cervical Sprain A cervical sprain is an injury in the neck in which the strong, fibrous tissues (ligaments) that connect your neck bones stretch or tear. Cervical sprains can range from mild to severe. Severe cervical sprains can cause the neck vertebrae to be unstable. This can lead to damage of the spinal cord and can result in serious nervous system problems. The amount of time it takes for a cervical sprain to get better depends on the cause and extent of the injury. Most cervical sprains heal in 1 to 3 weeks. CAUSES  Severe cervical sprains may be caused by:   Contact sport injuries (such as from football, rugby, wrestling, hockey, auto racing, gymnastics, diving, martial arts, or boxing).   Motor vehicle collisions.   Whiplash injuries. This is an injury from a sudden forward and backward whipping movement of the head and neck.  Falls.  Mild cervical sprains may be caused by:   Being in an awkward position, such as while cradling a telephone between your ear and shoulder.   Sitting in a chair that does not offer proper support.   Working at a poorly Marketing executive station.   Looking up or down for long periods of time.  SYMPTOMS   Pain, soreness, stiffness, or a burning sensation in the front, back, or sides of the neck. This discomfort may develop immediately after the injury or slowly, 24 hours or more after the injury.   Pain or tenderness directly in the middle of the back of the neck.   Shoulder or upper back pain.   Limited ability to move the neck.   Headache.   Dizziness.   Weakness, numbness, or tingling in the hands or arms.   Muscle spasms.   Difficulty swallowing or chewing.   Tenderness and swelling of the neck.  DIAGNOSIS  Most of the  time your health care provider can diagnose a cervical sprain by taking your history and doing a physical exam. Your health care provider will ask about previous neck injuries and any known neck problems, such as arthritis in the neck. X-rays may be taken to find out if there are any other problems, such as with the bones of the neck. Other tests, such as a CT scan or MRI, may also be needed.  TREATMENT  Treatment depends on the severity of the cervical sprain. Mild sprains can be treated with rest, keeping the neck in place (immobilization), and pain medicines. Severe cervical sprains are immediately immobilized. Further treatment is done to help with pain, muscle spasms, and other symptoms and may include:  Medicines, such as pain relievers, numbing medicines, or muscle relaxants.   Physical therapy. This may involve stretching exercises, strengthening exercises, and posture training. Exercises and improved posture can help stabilize the neck, strengthen muscles, and help stop symptoms from returning.  HOME CARE INSTRUCTIONS   Put ice on the injured area.   Put ice in a plastic bag.   Place a towel between your skin and the bag.   Leave the ice on for 15-20 minutes, 3-4 times a day.   If your injury was severe, you may have been given a cervical collar to wear. A cervical collar is a two-piece collar designed to keep your neck from moving while it heals.  Do not  remove the collar unless instructed by your health care provider.  If you have long hair, keep it outside of the collar.  Ask your health care provider before making any adjustments to your collar. Minor adjustments may be required over time to improve comfort and reduce pressure on your chin or on the back of your head.  Ifyou are allowed to remove the collar for cleaning or bathing, follow your health care provider's instructions on how to do so safely.  Keep your collar clean by wiping it with mild soap and water and  drying it completely. If the collar you have been given includes removable pads, remove them every 1-2 days and hand wash them with soap and water. Allow them to air dry. They should be completely dry before you wear them in the collar.  If you are allowed to remove the collar for cleaning and bathing, wash and dry the skin of your neck. Check your skin for irritation or sores. If you see any, tell your health care provider.  Do not drive while wearing the collar.   Only take over-the-counter or prescription medicines for pain, discomfort, or fever as directed by your health care provider.   Keep all follow-up appointments as directed by your health care provider.   Keep all physical therapy appointments as directed by your health care provider.   Make any needed adjustments to your workstation to promote good posture.   Avoid positions and activities that make your symptoms worse.   Warm up and stretch before being active to help prevent problems.  SEEK MEDICAL CARE IF:   Your pain is not controlled with medicine.   You are unable to decrease your pain medicine over time as planned.   Your activity level is not improving as expected.  SEEK IMMEDIATE MEDICAL CARE IF:   You develop any bleeding.  You develop stomach upset.  You have signs of an allergic reaction to your medicine.   Your symptoms get worse.   You develop new, unexplained symptoms.   You have numbness, tingling, weakness, or paralysis in any part of your body.  MAKE SURE YOU:   Understand these instructions.  Will watch your condition.  Will get help right away if you are not doing well or get worse.   This information is not intended to replace advice given to you by your health care provider. Make sure you discuss any questions you have with your health care provider.   Document Released: 11/09/2006 Document Revised: 01/17/2013 Document Reviewed: 07/20/2012 Elsevier Interactive Patient  Education 2016 [employer]  Contusion A contusion is a deep bruise. Contusions are the result of a blunt injury to tissues and muscle fibers under the skin. The injury causes bleeding under the skin. The skin overlying the contusion may turn blue, purple, or yellow. Minor injuries will give you a painless contusion, but more severe contusions may stay painful and swollen for a few weeks.  CAUSES  This condition is usually caused by a blow, trauma, or direct force to an area of the body. SYMPTOMS  Symptoms of this condition include:  Swelling of the injured area.  Pain and tenderness in the injured area.  Discoloration. The area may have redness and then turn blue, purple, or yellow. DIAGNOSIS  This condition is diagnosed based on a physical exam and medical history. An X-ray, CT scan, or MRI may be needed to determine if there are any associated injuries, such as broken bones (fractures). TREATMENT  Specific  treatment for this condition depends on what area of the body was injured. In general, the best treatment for a contusion is resting, icing, applying pressure to (compression), and elevating the injured area. This is often called the RICE strategy. Over-the-counter anti-inflammatory medicines may also be recommended for pain control.  HOME CARE INSTRUCTIONS   Rest the injured area.  If directed, apply ice to the injured area:  Put ice in a plastic bag.  Place a towel between your skin and the bag.  Leave the ice on for 20 minutes, 2-3 times per day.  If directed, apply light compression to the injured area using an elastic bandage. Make sure the bandage is not wrapped too tightly. Remove and reapply the bandage as directed by your health care provider.  If possible, raise (elevate) the injured area above the level of your heart while you are sitting or lying down.  Take over-the-counter and prescription medicines only as told by your health care provider. SEEK MEDICAL CARE  IF:  Your symptoms do not improve after several days of treatment.  Your symptoms get worse.  You have difficulty moving the injured area. SEEK IMMEDIATE MEDICAL CARE IF:   You have severe pain.  You have numbness in a hand or foot.  Your hand or foot turns pale or cold.   This information is not intended to replace advice given to you by your health care provider. Make sure you discuss any questions you have with your health care provider.   Document Released: 10/22/2004 Document Revised: 10/03/2014 Document Reviewed: 05/30/2014 Elsevier Interactive Patient Education 2016 [employer]  Tourist information centre managerMotor Vehicle Collision After a car crash (motor vehicle collision), it is normal to have bruises and sore muscles. The first 24 hours usually feel the worst. After that, you will likely start to feel better each day. HOME CARE  Put ice on the injured area.  Put ice in a plastic bag.  Place a towel between your skin and the bag.  Leave the ice on for 15-20 minutes, 03-04 times a day.  Drink enough fluids to keep your pee (urine) clear or pale yellow.  Do not drink alcohol.  Take a warm shower or bath 1 or 2 times a day. This helps your sore muscles.  Return to activities as told by your doctor. Be careful when lifting. Lifting can make neck or back pain worse.  Only take medicine as told by your doctor. Do not use aspirin. GET HELP RIGHT AWAY IF:   Your arms or legs tingle, feel weak, or lose feeling (numbness).  You have headaches that do not get better with medicine.  You have neck pain, especially in the middle of the back of your neck.  You cannot control when you pee (urinate) or poop (bowel movement).  Pain is getting worse in any part of your body.  You are short of breath, dizzy, or pass out (faint).  You have chest pain.  You feel sick to your stomach (nauseous), throw up (vomit), or sweat.  You have belly (abdominal) pain that gets worse.  There is blood in your  pee, poop, or throw up.  You have pain in your shoulder (shoulder strap areas).  Your problems are getting worse. MAKE SURE YOU:   Understand these instructions.  Will watch your condition.  Will get help right away if you are not doing well or get worse.   This information is not intended to replace advice given to you by your health care  provider. Make sure you discuss any questions you have with your health care provider.   Document Released: 07/01/2007 Document Revised: 04/06/2011 Document Reviewed: 06/11/2010 Elsevier Interactive Patient Education [employer]
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
## 2014-12-29 NOTE — ED Notes (Signed)
Pt has hx of TBI --

## 2015-01-02 ENCOUNTER — Emergency Department (HOSPITAL_COMMUNITY)
Admission: EM | Admit: 2015-01-02 | Discharge: 2015-01-02 | Disposition: A | Discharge status: Home / Self Care | Payer: Medicaid Other | Attending: Emergency Medicine | Admitting: Emergency Medicine

## 2015-01-02 ENCOUNTER — Encounter (HOSPITAL_COMMUNITY): Payer: Self-pay | Admitting: Emergency Medicine

## 2015-01-02 ENCOUNTER — Emergency Department (HOSPITAL_COMMUNITY): Payer: Medicaid Other

## 2015-01-02 DIAGNOSIS — N939 Abnormal uterine and vaginal bleeding, unspecified: Secondary | ICD-10-CM | POA: Diagnosis not present

## 2015-01-02 DIAGNOSIS — R079 Chest pain, unspecified: Secondary | ICD-10-CM | POA: Diagnosis present

## 2015-01-02 DIAGNOSIS — F319 Bipolar disorder, unspecified: Secondary | ICD-10-CM | POA: Insufficient documentation

## 2015-01-02 DIAGNOSIS — Z915 Personal history of self-harm: Secondary | ICD-10-CM | POA: Diagnosis not present

## 2015-01-02 DIAGNOSIS — F17210 Nicotine dependence, cigarettes, uncomplicated: Secondary | ICD-10-CM | POA: Insufficient documentation

## 2015-01-02 DIAGNOSIS — B20 Human immunodeficiency virus [HIV] disease: Secondary | ICD-10-CM | POA: Insufficient documentation

## 2015-01-02 DIAGNOSIS — M25511 Pain in right shoulder: Secondary | ICD-10-CM | POA: Insufficient documentation

## 2015-01-02 DIAGNOSIS — F259 Schizoaffective disorder, unspecified: Secondary | ICD-10-CM | POA: Diagnosis not present

## 2015-01-02 DIAGNOSIS — R0789 Other chest pain: Secondary | ICD-10-CM | POA: Diagnosis not present

## 2015-01-02 DIAGNOSIS — R1084 Generalized abdominal pain: Secondary | ICD-10-CM

## 2015-01-02 DIAGNOSIS — Z79899 Other long term (current) drug therapy: Secondary | ICD-10-CM | POA: Diagnosis not present

## 2015-01-02 DIAGNOSIS — Z87820 Personal history of traumatic brain injury: Secondary | ICD-10-CM | POA: Diagnosis not present

## 2015-01-02 LAB — CBC WITH DIFFERENTIAL/PLATELET
BASOS ABS: 0 10*3/uL (ref 0.0–0.1)
BASOS PCT: 0 %
EOS ABS: 0.2 10*3/uL (ref 0.0–0.7)
EOS PCT: 2 %
HCT: 34.1 % — ABNORMAL LOW (ref 36.0–46.0)
Hemoglobin: 11.3 g/dL — ABNORMAL LOW (ref 12.0–15.0)
LYMPHS PCT: 21 %
Lymphs Abs: 1.6 10*3/uL (ref 0.7–4.0)
MCH: 26.3 pg (ref 26.0–34.0)
MCHC: 33.1 g/dL (ref 30.0–36.0)
MCV: 79.5 fL (ref 78.0–100.0)
Monocytes Absolute: 0.9 10*3/uL (ref 0.1–1.0)
Monocytes Relative: 12 %
Neutro Abs: 4.8 10*3/uL (ref 1.7–7.7)
Neutrophils Relative %: 65 %
PLATELETS: 325 10*3/uL (ref 150–400)
RBC: 4.29 MIL/uL (ref 3.87–5.11)
RDW: 15 % (ref 11.5–15.5)
WBC: 7.4 10*3/uL (ref 4.0–10.5)

## 2015-01-02 LAB — COMPREHENSIVE METABOLIC PANEL
ALBUMIN: 2.9 g/dL — AB (ref 3.5–5.0)
ALT: 17 U/L (ref 14–54)
AST: 17 U/L (ref 15–41)
Alkaline Phosphatase: 90 U/L (ref 38–126)
Anion gap: 8 (ref 5–15)
BUN: 6 mg/dL (ref 6–20)
CHLORIDE: 105 mmol/L (ref 101–111)
CO2: 23 mmol/L (ref 22–32)
CREATININE: 0.83 mg/dL (ref 0.44–1.00)
Calcium: 8.5 mg/dL — ABNORMAL LOW (ref 8.9–10.3)
GFR calc Af Amer: >60 mL/min (ref >60)
GFR calc non Af Amer: >60 mL/min (ref >60)
GLUCOSE: 145 mg/dL — AB (ref 65–99)
POTASSIUM: 3.6 mmol/L (ref 3.5–5.1)
SODIUM: 136 mmol/L (ref 135–145)
Total Bilirubin: 0.6 mg/dL (ref 0.3–1.2)
Total Protein: 6.2 g/dL — ABNORMAL LOW (ref 6.5–8.1)

## 2015-01-02 MED ORDER — IOHEXOL 300 MG/ML  SOLN
100.0000 mL | Freq: Once | INTRAMUSCULAR | Status: AC | PRN
  Administered 2015-01-02: 100 mL via INTRAVENOUS

## 2015-01-02 MED ORDER — CYCLOBENZAPRINE HCL 10 MG PO TABS: 10.0000 mg | ORAL_TABLET | Freq: Every day | ORAL | Status: DC

## 2015-01-02 MED ORDER — KETOROLAC TROMETHAMINE 30 MG/ML IJ SOLN
30.0000 mg | Freq: Once | INTRAMUSCULAR | Status: AC
  Administered 2015-01-02: 30 mg via INTRAVENOUS
  Filled 2015-01-02: qty 1

## 2015-01-02 MED ORDER — KETOROLAC TROMETHAMINE 30 MG/ML IM SOLN: 30.0000 mg | Freq: Once | INTRAMUSCULAR | Status: DC

## 2015-01-02 MED ORDER — OXYCODONE-ACETAMINOPHEN 5-325 MG PO TABS
1.0000 | ORAL_TABLET | Freq: Once | ORAL | Status: AC
  Administered 2015-01-02: 1 via ORAL
  Filled 2015-01-02: qty 1
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
## 2015-01-02 NOTE — ED Notes (Signed)
PA student at bedside.

## 2015-01-02 NOTE — ED Notes (Signed)
Patient transported to X-ray 

## 2015-01-02 NOTE — ED Notes (Signed)
Pt requesting pain medicaton, [place] awARE

## 2015-01-02 NOTE — ED Notes (Signed)
Pt disclosed to this RN that she is HIV+, without treatment.

## 2015-01-02 NOTE — ED Notes (Signed)
Patient transported to CT 

## 2015-01-02 NOTE — ED Notes (Signed)
Pt requesting pain medication at this time, pt made aware there is no order at this time, will make PA aware
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
## 2015-01-02 NOTE — ED Notes (Signed)
PA at bedside.
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
## 2015-02-22 ENCOUNTER — Other Ambulatory Visit: Payer: Medicaid Other

## 2015-02-22 ENCOUNTER — Ambulatory Visit: Payer: Medicaid Other

## 2015-02-22 ENCOUNTER — Other Ambulatory Visit (HOSPITAL_COMMUNITY)
Admission: RE | Admit: 2015-02-22 | Discharge: 2015-02-22 | Disposition: A | Discharge status: Home / Self Care | Payer: Medicaid Other | Source: Ambulatory Visit | Attending: Internal Medicine | Admitting: Internal Medicine

## 2015-02-22 DIAGNOSIS — Z113 Encounter for screening for infections with a predominantly sexual mode of transmission: Secondary | ICD-10-CM | POA: Insufficient documentation

## 2015-02-22 DIAGNOSIS — Z79899 Other long term (current) drug therapy: Secondary | ICD-10-CM

## 2015-02-22 DIAGNOSIS — B20 Human immunodeficiency virus [HIV] disease: Secondary | ICD-10-CM

## 2015-02-22 LAB — CBC WITH DIFFERENTIAL/PLATELET
Basophils Absolute: 0 10*3/uL (ref 0.0–0.1)
Basophils Relative: 0 % (ref 0–1)
EOS PCT: 2 % (ref 0–5)
Eosinophils Absolute: 0.1 10*3/uL (ref 0.0–0.7)
HEMATOCRIT: 37.7 % (ref 36.0–46.0)
HEMOGLOBIN: 12.2 g/dL (ref 12.0–15.0)
LYMPHS PCT: 40 % (ref 12–46)
Lymphs Abs: 2.8 10*3/uL (ref 0.7–4.0)
MCH: 25.6 pg — ABNORMAL LOW (ref 26.0–34.0)
MCHC: 32.4 g/dL (ref 30.0–36.0)
MCV: 79.2 fL (ref 78.0–100.0)
MONO ABS: 0.6 10*3/uL (ref 0.1–1.0)
MPV: 9.9 fL (ref 8.6–12.4)
Monocytes Relative: 8 % (ref 3–12)
Neutro Abs: 3.5 10*3/uL (ref 1.7–7.7)
Neutrophils Relative %: 50 % (ref 43–77)
Platelets: 344 10*3/uL (ref 150–400)
RBC: 4.76 MIL/uL (ref 3.87–5.11)
RDW: 15.4 % (ref 11.5–15.5)
WBC: 7 10*3/uL (ref 4.0–10.5)

## 2015-02-22 LAB — COMPLETE METABOLIC PANEL WITH GFR
ALBUMIN: 3.6 g/dL (ref 3.6–5.1)
ALK PHOS: 80 U/L (ref 33–115)
ALT: 12 U/L (ref 6–29)
AST: 14 U/L (ref 10–35)
BUN: 7 mg/dL (ref 7–25)
CALCIUM: 8.5 mg/dL — AB (ref 8.6–10.2)
CHLORIDE: 104 mmol/L (ref 98–110)
CO2: 21 mmol/L (ref 20–31)
CREATININE: 0.84 mg/dL (ref 0.50–1.10)
GFR, Est African American: >89 mL/min (ref >=60)
GFR, Est Non African American: 83 mL/min (ref >=60)
Glucose, Bld: 85 mg/dL (ref 65–99)
Potassium: 3.8 mmol/L (ref 3.5–5.3)
SODIUM: 137 mmol/L (ref 135–146)
Total Bilirubin: 0.6 mg/dL (ref 0.2–1.2)
Total Protein: 6.5 g/dL (ref 6.1–8.1)

## 2015-02-22 LAB — LIPID PANEL
CHOL/HDL RATIO: 3.1 ratio (ref <=5.0)
CHOLESTEROL: 159 mg/dL (ref 125–200)
HDL: 52 mg/dL (ref >=46)
LDL CALC: 91 mg/dL (ref <130)
TRIGLYCERIDES: 79 mg/dL (ref <150)
VLDL: 16 mg/dL (ref <30)

## 2015-02-22 LAB — T-HELPER CELL (CD4) - (RCID CLINIC ONLY)
CD4 T CELL ABS: 1140 /uL (ref 400–2700)
CD4 T CELL HELPER: 39 % (ref 33–55)

## 2015-02-23 LAB — RPR

## 2015-02-25 LAB — URINE CYTOLOGY ANCILLARY ONLY
CHLAMYDIA, DNA PROBE: NEGATIVE
NEISSERIA GONORRHEA: NEGATIVE

## 2015-02-26 LAB — HIV-1 RNA QUANT-NO REFLEX-BLD
HIV 1 RNA QUANT: 1270 {copies}/mL — AB (ref <20)
HIV-1 RNA Quant, Log: 3.1 Log copies/mL — ABNORMAL HIGH (ref <1.30)

## 2015-02-28 ENCOUNTER — Other Ambulatory Visit: Payer: Medicaid Other

## 2015-03-07 ENCOUNTER — Encounter: Payer: Self-pay | Admitting: Internal Medicine

## 2015-03-07 ENCOUNTER — Ambulatory Visit (INDEPENDENT_AMBULATORY_CARE_PROVIDER_SITE_OTHER): Payer: Medicaid Other | Admitting: Internal Medicine

## 2015-03-07 VITALS — BP 138/85 | HR 82 | Temp 98.6°F | Wt 204.0 lb

## 2015-03-07 DIAGNOSIS — B20 Human immunodeficiency virus [HIV] disease: Secondary | ICD-10-CM | POA: Diagnosis present

## 2015-03-07 DIAGNOSIS — F4321 Adjustment disorder with depressed mood: Secondary | ICD-10-CM | POA: Diagnosis not present

## 2015-03-09 DIAGNOSIS — F4321 Adjustment disorder with depressed mood: Secondary | ICD-10-CM | POA: Insufficient documentation

## 2015-03-09 NOTE — Assessment & Plan Note (Signed)
I again encouraged starting therapy due to active virus and counseled on the risk of active virus including HIVAN, cardiomyopathy and other organ dysfunction.  She is considering starting and has Stribild at home. If she starts, she will call for follow up 1 month after starting, otherwise RTC 6 months.

## 2015-03-09 NOTE — Progress Notes (Signed)
  Subjective:    Patient ID: [name], female    DOB: 10/08/1965, 50 y.o.   MRN: [id number]  HPI She comes in for routine follow up for HIV.   She has been a long term non progressor and not interested in treatment. She briefly start Stribild but stopped after not wanting to take it anymore.  No weight loss, no diarrhea.   Her CD4 is 1140 and viral load 1270.  She is tearful today because her mother has been sick and underwent BKA due to diabetes complications.  She is interested in counseling.  May be interested in starting ARVs.    Review of Systems  Constitutional: Negative for appetite change and fatigue.  HENT: Negative for sore throat and trouble swallowing.   Gastrointestinal: Negative for diarrhea.  Skin: Negative for rash.  Neurological: Negative for dizziness and headaches.  Psychiatric/Behavioral: Positive for dysphoric mood.       Objective:   Physical Exam  Constitutional: She appears well-developed and well-nourished. No distress.  HENT:  Mouth/Throat: No oropharyngeal exudate.  Eyes: No scleral icterus.  Cardiovascular: Normal rate, regular rhythm and normal heart sounds.   No murmur heard. Pulmonary/Chest: Effort normal and breath sounds normal. No respiratory distress.  Lymphadenopathy:    She has no cervical adenopathy.  Skin: No rash noted.          Assessment & Plan:

## 2015-03-09 NOTE — Assessment & Plan Note (Signed)
She is interested in counseling and will be scheduled with our counselor.

## 2015-03-14 ENCOUNTER — Ambulatory Visit: Payer: Self-pay

## 2015-03-30 ENCOUNTER — Emergency Department (HOSPITAL_COMMUNITY)
Admission: EM | Admit: 2015-03-30 | Discharge: 2015-03-30 | Disposition: A | Discharge status: Home / Self Care | Payer: Medicaid Other | Attending: Emergency Medicine | Admitting: Emergency Medicine

## 2015-03-30 ENCOUNTER — Encounter (HOSPITAL_COMMUNITY): Payer: Self-pay | Admitting: Emergency Medicine

## 2015-03-30 ENCOUNTER — Emergency Department (HOSPITAL_COMMUNITY): Payer: Medicaid Other

## 2015-03-30 DIAGNOSIS — F319 Bipolar disorder, unspecified: Secondary | ICD-10-CM | POA: Diagnosis not present

## 2015-03-30 DIAGNOSIS — B20 Human immunodeficiency virus [HIV] disease: Secondary | ICD-10-CM | POA: Insufficient documentation

## 2015-03-30 DIAGNOSIS — F17210 Nicotine dependence, cigarettes, uncomplicated: Secondary | ICD-10-CM | POA: Diagnosis not present

## 2015-03-30 DIAGNOSIS — F259 Schizoaffective disorder, unspecified: Secondary | ICD-10-CM | POA: Insufficient documentation

## 2015-03-30 DIAGNOSIS — R062 Wheezing: Secondary | ICD-10-CM | POA: Diagnosis not present

## 2015-03-30 DIAGNOSIS — I10 Essential (primary) hypertension: Secondary | ICD-10-CM | POA: Diagnosis not present

## 2015-03-30 DIAGNOSIS — R05 Cough: Secondary | ICD-10-CM | POA: Diagnosis not present

## 2015-03-30 DIAGNOSIS — Z87820 Personal history of traumatic brain injury: Secondary | ICD-10-CM | POA: Insufficient documentation

## 2015-03-30 DIAGNOSIS — R0981 Nasal congestion: Secondary | ICD-10-CM | POA: Insufficient documentation

## 2015-03-30 DIAGNOSIS — R112 Nausea with vomiting, unspecified: Secondary | ICD-10-CM | POA: Insufficient documentation

## 2015-03-30 DIAGNOSIS — Z79899 Other long term (current) drug therapy: Secondary | ICD-10-CM | POA: Insufficient documentation

## 2015-03-30 DIAGNOSIS — R52 Pain, unspecified: Secondary | ICD-10-CM | POA: Diagnosis not present

## 2015-03-30 DIAGNOSIS — E669 Obesity, unspecified: Secondary | ICD-10-CM | POA: Insufficient documentation

## 2015-03-30 DIAGNOSIS — Z915 Personal history of self-harm: Secondary | ICD-10-CM | POA: Insufficient documentation

## 2015-03-30 DIAGNOSIS — R067 Sneezing: Secondary | ICD-10-CM | POA: Insufficient documentation

## 2015-03-30 DIAGNOSIS — R6889 Other general symptoms and signs: Secondary | ICD-10-CM

## 2015-03-30 MED ORDER — ACETAMINOPHEN 325 MG PO TABS
650.0000 mg | ORAL_TABLET | Freq: Once | ORAL | Status: DC
  Filled 2015-03-30: qty 2

## 2015-03-30 MED ORDER — GUAIFENESIN 100 MG/5ML PO LIQD: 100.0000 mg | ORAL | Status: DC | PRN

## 2015-03-30 MED ORDER — BENZONATATE 100 MG PO CAPS: 100.0000 mg | ORAL_CAPSULE | Freq: Three times a day (TID) | ORAL | Status: DC | PRN

## 2015-03-30 NOTE — ED Notes (Signed)
Pt c/o productive cough, chills, body aches x 2 days. Pt denies N/V/D.

## 2015-03-30 NOTE — ED Provider Notes (Signed)
CSN: [id number]     Arrival date & time 03/30/15  1005 History   First MD Initiated Contact with Patient 03/30/15 1006     Chief Complaint  Patient presents with  . Influenza  . Cough  . Generalized Body Aches  . Chills     (Consider location/radiation/quality/duration/timing/severity/associated sxs/prior Treatment) HPI   50 year old female with history of HIV noncompliant with her medication, history of bipolar and substance abuse presenting with flulike symptoms. Patient states 3 days ago she developed generalized body aches, chills, lightheadedness. Yesterday she developed productive cough, nasal congestion, sneezing, wheezing, nausea and vomited once she denies having fever, headache, ear pain, sore throat, shortness of breath, abdominal pain, diarrhea or constipation, or rash. No specific treatment tried. Patient does have an infectious specialist, Dr. Luciana Axe.  She reportedly seen him last month. She does not know her CD4 count or ast viral load. She does not take her HIV medication due to "fear of HIV".  She is a current smoker.      Past Medical History  Diagnosis Date  . Bipolar disorder (HCC)   . Schizoaffective disorder   . Suicide and self-inflicted injury (HCC)   . Hypertension   . Traumatic brain injury (HCC)   . Substance abuse   . HIV (human immunodeficiency virus infection) [facility])    Past Surgical History  Procedure Laterality Date  . 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 Bowel obstruction    . Surgery r/t bowel obstruction     No family history on file. Social History  Substance Use Topics  . Smoking status: Current Every Day Smoker -- 0.40 packs/day for 30 years    Types: Cigarettes  . Smokeless tobacco: Never Used     Comment: trying to cut back  . Alcohol Use: No     Comment: 2-3 beers   OB History    No data available     Review of Systems  All other systems reviewed and are negative.     Allergies  Ibuprofen; Lithium; Risperidone and related; and  Thorazine  Home Medications   Prior to Admission medications   Medication Sig Start Date End Date Taking? Authorizing Provider  cyclobenzaprine (FLEXERIL) 10 MG tablet Take 1 tablet (10 mg total) by mouth at bedtime. 01/02/15   Charlestine Night, PA-C  FLUoxetine (PROZAC) 20 MG capsule Take 1 capsule (20 mg total) by mouth daily. For depression. Patient not taking: Reported on 03/07/2015 02/02/12   Tamala Julian, PA-C  HYDROcodone-acetaminophen (NORCO) 5-325 MG tablet Take 1-2 tablets by mouth every 6 (six) hours as needed. Patient not taking: Reported on 03/07/2015 12/29/14   Geoffery Lyons, MD  LORazepam (ATIVAN) 1 MG tablet Take 1 mg by mouth every 8 (eight) hours.    Historical Provider, MD  QUEtiapine (SEROQUEL) 200 MG tablet Take 200 mg by mouth 4 (four) times daily. 12/22/13   Historical Provider, MD   BP 128/74 mmHg  Pulse 85  Temp(Src) 98.2 F (36.8 C)  Resp 18  Ht  (1.626 m)  Wt 96.616 kg  BMI 36.54 kg/m2  SpO2 97%  LMP 03/26/2015 Physical Exam  Constitutional: She appears well-developed and well-nourished. No distress.  Obese African-American female laying in bed in no acute discomfort  HENT:  Head: Atraumatic.  Ears: TMs are mildly erythematous bilaterally without effusion Nose: Normal nares Throat: Uvula is midline no tonsillar enlargement or exudates, no trismus  Eyes: Conjunctivae are normal.  Neck: Neck supple.  No nuchal rigidity  Cardiovascular:  Normal rate, regular rhythm and intact distal pulses.   Pulmonary/Chest: Effort normal and breath sounds normal. No respiratory distress. She has no wheezes. She has no rales.  Abdominal: Soft. There is no tenderness.  Musculoskeletal: She exhibits no edema.  Lymphadenopathy:    She has no cervical adenopathy.  Neurological: She is alert.  Skin: No rash noted.  Psychiatric: She has a normal mood and affect.  Nursing note and vitals reviewed.   ED Course  Procedures (including critical care time) Labs  Review Labs Reviewed - No data to display  Imaging Review Dg Chest 2 View  03/30/2015  CLINICAL DATA:  Productive cough. Body aches. Chills. Hypertension. HIV. EXAM: CHEST  2 VIEW COMPARISON:  01/02/2015 FINDINGS: Lateral view degraded by patient arm position. Midline trachea. Normal heart size and mediastinal contours. No pleural effusion or pneumothorax. Bibasilar volume loss with scarring. IMPRESSION: No acute cardiopulmonary disease. Electronically Signed   By: Jeronimo GreavesKyle  Talbot M.D.   On: 03/30/2015 11:53   I have personally reviewed and evaluated these images and lab results as part of my medical decision-making.   EKG Interpretation None      MDM   Final diagnoses:  Flu-like symptoms    BP 113/69 mmHg  Pulse 87  Temp(Src) 98.5 F (36.9 C) (Oral)  Resp 16  Ht 5\' 4"  (1.626 m)  Wt 96.616 kg  BMI 36.54 kg/m2  SpO2 99%  LMP 03/26/2015   10:57 AM Patient presents with flulike symptoms. She is afebrile with stable normal vital signs. She is however noncompliant with her HIV medication and does not know her CD4 count or viral load does make in her immunocompromised therefore I will obtain a chest x-ray to rule out pneumonia.  12:50 PM Chest x-ray shows no focal infiltrate concerning for pneumonia. Patient stable for discharge with symptomatic treatment. I encourage patient to follow-up with her infectious disease specialist to resume on her HIV medication and for further health management. Return precautions discussed.  Fayrene HelperBowie Allannah Kempen, PA-C 03/30/15 1251  Tilden FossaElizabeth Rees, MD 03/30/15 [phone]

## 2015-03-30 NOTE — Discharge Instructions (Signed)
Viral Infections °A viral infection can be caused by different types of viruses. Most viral infections are not serious and resolve on their own. However, some infections may cause severe symptoms and may lead to further complications. °SYMPTOMS °Viruses can frequently cause: °· Minor sore throat. °· Aches and pains. °· Headaches. °· Runny nose. °· Different types of rashes. °· Watery eyes. °· Tiredness. °· Cough. °· Loss of appetite. °· Gastrointestinal infections, resulting in nausea, vomiting, and diarrhea. °These symptoms do not respond to antibiotics because the infection is not caused by bacteria. However, you might catch a bacterial infection following the viral infection. This is sometimes called a "superinfection." Symptoms of such a bacterial infection may include: °· Worsening sore throat with pus and difficulty swallowing. °· Swollen neck glands. °· Chills and a high or persistent fever. °· Severe headache. °· Tenderness over the sinuses. °· Persistent overall ill feeling (malaise), muscle aches, and tiredness (fatigue). °· Persistent cough. °· Yellow, green, or brown mucus production with coughing. °HOME CARE INSTRUCTIONS  °· Only take over-the-counter or prescription medicines for pain, discomfort, diarrhea, or fever as directed by your caregiver. °· Drink enough water and fluids to keep your urine clear or pale yellow. Sports drinks can provide valuable electrolytes, sugars, and hydration. °· Get plenty of rest and maintain proper nutrition. Soups and broths with crackers or rice are fine. °SEEK IMMEDIATE MEDICAL CARE IF:  °· You have severe headaches, shortness of breath, chest pain, neck pain, or an unusual rash. °· You have uncontrolled vomiting, diarrhea, or you are unable to keep down fluids. °· You or your child has an oral temperature above 102° F (38.9° C), not controlled by medicine. °· Your baby is older than 3 months with a rectal temperature of 102° F (38.9° C) or higher. °· Your baby is 3  months old or younger with a rectal temperature of 100.4° F (38° C) or higher. °MAKE SURE YOU:  °· Understand these instructions. °· Will watch your condition. °· Will get help right away if you are not doing well or get worse. °  °This information is not intended to replace advice given to you by your health care provider. Make sure you discuss any questions you have with your health care provider. °  °Document Released: 10/22/2004 Document Revised: 04/06/2011 Document Reviewed: 06/20/2014 °Elsevier Interactive Patient Education ©2016 [employer] ° °

## 2015-07-31 ENCOUNTER — Emergency Department (HOSPITAL_COMMUNITY)
Admission: EM | Admit: 2015-07-31 | Discharge: 2015-07-31 | Disposition: A | Discharge status: Other Acute Inpatient Hospital | Payer: Medicaid Other | Attending: Dermatology | Admitting: Dermatology

## 2015-07-31 ENCOUNTER — Encounter (HOSPITAL_COMMUNITY): Payer: Self-pay

## 2015-07-31 ENCOUNTER — Inpatient Hospital Stay (HOSPITAL_COMMUNITY)
Admission: AD | Admit: 2015-07-31 | Discharge: 2015-08-02 | DRG: 885 | Disposition: A | Discharge status: Home / Self Care | Payer: Medicaid Other | Source: Intra-hospital | Attending: Psychiatry | Admitting: Psychiatry

## 2015-07-31 DIAGNOSIS — F1310 Sedative, hypnotic or anxiolytic abuse, uncomplicated: Secondary | ICD-10-CM | POA: Clinically undetermined

## 2015-07-31 DIAGNOSIS — F17210 Nicotine dependence, cigarettes, uncomplicated: Secondary | ICD-10-CM | POA: Diagnosis not present

## 2015-07-31 DIAGNOSIS — F1120 Opioid dependence, uncomplicated: Secondary | ICD-10-CM | POA: Diagnosis not present

## 2015-07-31 DIAGNOSIS — I10 Essential (primary) hypertension: Secondary | ICD-10-CM | POA: Diagnosis present

## 2015-07-31 DIAGNOSIS — R45851 Suicidal ideations: Secondary | ICD-10-CM | POA: Diagnosis present

## 2015-07-31 DIAGNOSIS — F250 Schizoaffective disorder, bipolar type: Principal | ICD-10-CM | POA: Diagnosis present

## 2015-07-31 DIAGNOSIS — Z21 Asymptomatic human immunodeficiency virus [HIV] infection status: Secondary | ICD-10-CM | POA: Insufficient documentation

## 2015-07-31 DIAGNOSIS — S58112A Complete traumatic amputation at level between elbow and wrist, left arm, initial encounter: Secondary | ICD-10-CM | POA: Diagnosis present

## 2015-07-31 DIAGNOSIS — B20 Human immunodeficiency virus [HIV] disease: Secondary | ICD-10-CM | POA: Diagnosis not present

## 2015-07-31 DIAGNOSIS — Z79899 Other long term (current) drug therapy: Secondary | ICD-10-CM | POA: Insufficient documentation

## 2015-07-31 DIAGNOSIS — F1220 Cannabis dependence, uncomplicated: Secondary | ICD-10-CM | POA: Clinically undetermined

## 2015-07-31 LAB — COMPREHENSIVE METABOLIC PANEL
ALT: 15 U/L (ref 14–54)
AST: 22 U/L (ref 15–41)
Albumin: 3.8 g/dL (ref 3.5–5.0)
Alkaline Phosphatase: 82 U/L (ref 38–126)
Anion gap: 7 (ref 5–15)
BILIRUBIN TOTAL: 0.7 mg/dL (ref 0.3–1.2)
BUN: 11 mg/dL (ref 6–20)
CALCIUM: 8.6 mg/dL — AB (ref 8.9–10.3)
CHLORIDE: 102 mmol/L (ref 101–111)
CO2: 24 mmol/L (ref 22–32)
CREATININE: 0.82 mg/dL (ref 0.44–1.00)
Glucose, Bld: 164 mg/dL — ABNORMAL HIGH (ref 65–99)
Potassium: 4.1 mmol/L (ref 3.5–5.1)
Sodium: 133 mmol/L — ABNORMAL LOW (ref 135–145)
TOTAL PROTEIN: 7.1 g/dL (ref 6.5–8.1)

## 2015-07-31 LAB — CBC
HCT: 39.4 % (ref 36.0–46.0)
Hemoglobin: 12.8 g/dL (ref 12.0–15.0)
MCH: 25.9 pg — AB (ref 26.0–34.0)
MCHC: 32.5 g/dL (ref 30.0–36.0)
MCV: 79.6 fL (ref 78.0–100.0)
PLATELETS: 261 10*3/uL (ref 150–400)
RBC: 4.95 MIL/uL (ref 3.87–5.11)
RDW: 16.4 % — AB (ref 11.5–15.5)
WBC: 6.7 10*3/uL (ref 4.0–10.5)

## 2015-07-31 LAB — RAPID URINE DRUG SCREEN, HOSP PERFORMED
AMPHETAMINES: NOT DETECTED
Barbiturates: NOT DETECTED
Benzodiazepines: POSITIVE — AB
Cocaine: NOT DETECTED
OPIATES: POSITIVE — AB
Tetrahydrocannabinol: POSITIVE — AB

## 2015-07-31 LAB — SALICYLATE LEVEL

## 2015-07-31 LAB — ACETAMINOPHEN LEVEL: Acetaminophen (Tylenol), Serum: <10 ug/mL — ABNORMAL LOW (ref 10–30)

## 2015-07-31 LAB — ETHANOL

## 2015-07-31 MED ORDER — CLONIDINE HCL 0.1 MG PO TABS: 0.1000 mg | ORAL_TABLET | ORAL | Status: DC

## 2015-07-31 MED ORDER — ALUM & MAG HYDROXIDE-SIMETH 200-200-20 MG/5ML PO SUSP: 30.0000 mL | ORAL | Status: DC | PRN

## 2015-07-31 MED ORDER — METHOCARBAMOL 500 MG PO TABS: 500.0000 mg | ORAL_TABLET | Freq: Three times a day (TID) | ORAL | Status: DC | PRN

## 2015-07-31 MED ORDER — QUETIAPINE FUMARATE 100 MG PO TABS
200.0000 mg | ORAL_TABLET | Freq: Four times a day (QID) | ORAL | Status: DC
  Administered 2015-07-31: 200 mg via ORAL
  Filled 2015-07-31: qty 2

## 2015-07-31 MED ORDER — ACETAMINOPHEN 325 MG PO TABS
650.0000 mg | ORAL_TABLET | ORAL | Status: DC | PRN
  Filled 2015-07-31: qty 2

## 2015-07-31 MED ORDER — FLUOXETINE HCL 20 MG PO CAPS
20.0000 mg | ORAL_CAPSULE | Freq: Every day | ORAL | Status: DC
  Administered 2015-07-31: 20 mg via ORAL
  Filled 2015-07-31: qty 1

## 2015-07-31 MED ORDER — CLONIDINE HCL 0.1 MG PO TABS
0.1000 mg | ORAL_TABLET | ORAL | Status: DC
  Administered 2015-08-02: 0.1 mg via ORAL
  Filled 2015-07-31 (×4): qty 1

## 2015-07-31 MED ORDER — FLUOXETINE HCL 20 MG PO CAPS
20.0000 mg | ORAL_CAPSULE | Freq: Every day | ORAL | Status: DC
  Administered 2015-08-01 – 2015-08-02 (×2): 20 mg via ORAL
  Filled 2015-07-31 (×4): qty 1

## 2015-07-31 MED ORDER — CLONIDINE HCL 0.1 MG PO TABS: 0.1000 mg | ORAL_TABLET | Freq: Every day | ORAL | Status: DC

## 2015-07-31 MED ORDER — METHOCARBAMOL 500 MG PO TABS
500.0000 mg | ORAL_TABLET | Freq: Three times a day (TID) | ORAL | Status: DC | PRN
  Administered 2015-08-01: 500 mg via ORAL
  Filled 2015-07-31: qty 1

## 2015-07-31 MED ORDER — ONDANSETRON HCL 4 MG PO TABS: 4.0000 mg | ORAL_TABLET | Freq: Three times a day (TID) | ORAL | Status: DC | PRN

## 2015-07-31 MED ORDER — HYDROXYZINE HCL 25 MG PO TABS
25.0000 mg | ORAL_TABLET | Freq: Four times a day (QID) | ORAL | Status: DC | PRN
  Administered 2015-08-01 – 2015-08-02 (×3): 25 mg via ORAL
  Filled 2015-07-31 (×3): qty 1

## 2015-07-31 MED ORDER — CLONIDINE HCL 0.1 MG PO TABS
0.1000 mg | ORAL_TABLET | Freq: Four times a day (QID) | ORAL | Status: AC
  Administered 2015-08-01 – 2015-08-02 (×5): 0.1 mg via ORAL
  Filled 2015-07-31 (×6): qty 1

## 2015-07-31 MED ORDER — ACETAMINOPHEN 325 MG PO TABS: 650.0000 mg | ORAL_TABLET | ORAL | Status: DC | PRN

## 2015-07-31 MED ORDER — NICOTINE 14 MG/24HR TD PT24
14.0000 mg | MEDICATED_PATCH | Freq: Every day | TRANSDERMAL | Status: DC
  Administered 2015-08-01 – 2015-08-02 (×2): 14 mg via TRANSDERMAL
  Filled 2015-07-31 (×4): qty 1

## 2015-07-31 MED ORDER — ONDANSETRON 4 MG PO TBDP
4.0000 mg | ORAL_TABLET | Freq: Four times a day (QID) | ORAL | Status: DC | PRN
  Administered 2015-08-01 (×2): 4 mg via ORAL
  Filled 2015-07-31 (×2): qty 1

## 2015-07-31 MED ORDER — DICYCLOMINE HCL 20 MG PO TABS: 20.0000 mg | ORAL_TABLET | Freq: Four times a day (QID) | ORAL | Status: DC | PRN

## 2015-07-31 MED ORDER — LOPERAMIDE HCL 2 MG PO CAPS: 2.0000 mg | ORAL_CAPSULE | ORAL | Status: DC | PRN

## 2015-07-31 MED ORDER — ONDANSETRON 4 MG PO TBDP: 4.0000 mg | ORAL_TABLET | Freq: Four times a day (QID) | ORAL | Status: DC | PRN

## 2015-07-31 MED ORDER — QUETIAPINE FUMARATE 200 MG PO TABS
200.0000 mg | ORAL_TABLET | Freq: Four times a day (QID) | ORAL | Status: DC
  Administered 2015-08-01 – 2015-08-02 (×6): 200 mg via ORAL
  Filled 2015-07-31 (×14): qty 1

## 2015-07-31 MED ORDER — HYDROXYZINE HCL 25 MG PO TABS
25.0000 mg | ORAL_TABLET | Freq: Four times a day (QID) | ORAL | Status: DC | PRN
  Administered 2015-07-31: 25 mg via ORAL
  Filled 2015-07-31: qty 1

## 2015-07-31 MED ORDER — LOPERAMIDE HCL 2 MG PO CAPS
2.0000 mg | ORAL_CAPSULE | ORAL | Status: DC | PRN
  Administered 2015-08-01: 4 mg via ORAL
  Filled 2015-07-31: qty 2

## 2015-07-31 MED ORDER — CLONIDINE HCL 0.1 MG PO TABS
0.1000 mg | ORAL_TABLET | Freq: Four times a day (QID) | ORAL | Status: DC
  Administered 2015-07-31 (×3): 0.1 mg via ORAL
  Filled 2015-07-31 (×3): qty 1

## 2015-07-31 MED ORDER — MAGNESIUM HYDROXIDE 400 MG/5ML PO SUSP: 30.0000 mL | Freq: Every day | ORAL | Status: DC | PRN
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
## 2015-07-31 NOTE — BHH Counselor (Signed)
[facility]

## 2015-07-31 NOTE — ED Notes (Signed)
Pt's mood is labile. She came to the unit restless walking on both halls and said that she wanted to leave. Writer talked with the pt about why she came to the hospital. She expresses wanting help to stop using heroin. Pt asked for the TV off and wanted to lay in her bed. She is currently resting in her room. Fluids given and blood pressure is being monitored for medication. Safety maintained in the SAPPU.

## 2015-07-31 NOTE — Progress Notes (Signed)
Patient denies complaint at present. Denies pain. No acute distress noted. [facility] [place] [facility] belongings.

## 2015-07-31 NOTE — Tx Team (Signed)
Initial Interdisciplinary Treatment Plan   PATIENT STRESSORS: Financial difficulties Substance abuse   PATIENT STRENGTHS: Wellsite geologistCommunication skills General fund of knowledge   PROBLEM LIST: Problem List/Patient Goals Date to be addressed Date deferred Reason deferred Estimated date of resolution  Depression 07/31/15     Suicidal ideation 07/31/15     Substance abuse 07/31/15     "Detox" 07/31/15     "Try to stay clean" 07/31/15                              DISCHARGE CRITERIA:  Improved stabilization in mood, thinking, and/or behavior Verbal commitment to aftercare and medication compliance Withdrawal symptoms are absent or subacute and managed without 24-hour nursing intervention  PRELIMINARY DISCHARGE PLAN: Outpatient therapy Medication management  PATIENT/FAMIILY INVOLVEMENT: This treatment plan has been presented to and reviewed with the patient, [name].  The patient and family have been given the opportunity to ask questions and make suggestions.  Norm ParcelHeather V [name] 07/31/2015, 11:53 PM

## 2015-07-31 NOTE — ED Notes (Signed)
Pt presents with c/o hallucinations and request for detox. Pt reports that she started taking pills and using heroin yesterday. Pt reports that this is not the first time that it has happened. Pt also reports that she is hearing voices that are telling her to commit suicide but patient reports that she does not feel as though she wants to commit suicide. She reports that she is looking for help.

## 2015-07-31 NOTE — ED Provider Notes (Signed)
CSN: [id number]     Arrival date & time 07/31/15  0818 History   First MD Initiated Contact with Patient 07/31/15 [id number]     Chief Complaint  Patient presents with  . Hallucinations  . Detox      (Consider location/radiation/quality/duration/timing/severity/associated sxs/prior Treatment) The history is provided by the patient.  Patient w hx bipolar disorder, schizoaffective disorder, c/o increased problems with heroin abuse, and etoh abuse in the past 1-2 weeks. Symptoms persistent/worsening. Denies specific inciting event or stressor. States also feeling more depressed w intermittent thoughts of suicide. Denies specific plan, or any attempt at self harm. Normal appetite. Does have some trouble sleeping at night. Denies wt loss. No recent physical illness. No fevers.  Denies any recent change in meds/new meds. States has outpatient therapy via [facility].       Past Medical History  Diagnosis Date  . Bipolar disorder (HCC)   . Schizoaffective disorder   . Suicide and self-inflicted injury (HCC)   . Hypertension   . Traumatic brain injury (HCC)   . Substance abuse   . HIV (human immunodeficiency virus infection) [facility](HCC)    Past Surgical History  Procedure Laterality Date  . 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 Bowel obstruction    . Surgery r/t bowel obstruction     No family history on file. Social History  Substance Use Topics  . Smoking status: Current Every Day Smoker -- 0.40 packs/day for 30 years    Types: Cigarettes  . Smokeless tobacco: Never Used     Comment: trying to cut back  . Alcohol Use: No     Comment: 2-3 beers   OB History    No data available     Review of Systems  Constitutional: Negative for fever and chills.  HENT: Negative for sore throat.   Eyes: Negative for pain.  Respiratory: Negative for shortness of breath.   Cardiovascular: Negative for chest pain.  Gastrointestinal: Negative for abdominal pain.  Genitourinary: Negative for flank pain.   Musculoskeletal: Negative for back pain and neck pain.  Skin: Negative for rash.  Neurological: Negative for weakness, numbness and headaches.  Hematological: Does not bruise/bleed easily.  Psychiatric/Behavioral: Positive for suicidal ideas, hallucinations and dysphoric mood. The patient is nervous/anxious.       Allergies  Ibuprofen; Lithium; Risperidone and related; and Thorazine  Home Medications   Prior to Admission medications   Medication Sig Start Date End Date Taking? Authorizing Provider  cyclobenzaprine (FLEXERIL) 10 MG tablet Take 1 tablet (10 mg total) by mouth at bedtime. 01/02/15  Yes Christopher Lawyer, PA-C  FLUoxetine (PROZAC) 20 MG capsule Take 1 capsule (20 mg total) by mouth daily. For depression. 02/02/12  Yes Rona RavensNeil T Mashburn, PA-C  HYDROcodone-acetaminophen (NORCO) 5-325 MG tablet Take 1-2 tablets by mouth every 6 (six) hours as needed. Patient taking differently: Take 1-2 tablets by mouth every 6 (six) hours as needed for moderate pain.  12/29/14  Yes Geoffery Lyonsouglas Delo, MD  LORazepam (ATIVAN) 1 MG tablet Take 1 mg by mouth every 8 (eight) hours as needed for anxiety or sleep.    Yes Historical Provider, MD  QUEtiapine (SEROQUEL) 200 MG tablet Take 200 mg by mouth 4 (four) times daily. 12/22/13  Yes Historical Provider, MD  benzonatate (TESSALON) 100 MG capsule Take 1 capsule (100 mg total) by mouth 3 (three) times daily as needed for cough. Patient not taking: Reported on 07/31/2015 03/30/15   Fayrene HelperBowie Tran, PA-C  guaiFENesin (ROBITUSSIN) 100 MG/5ML liquid Take  5-10 mLs (100-200 mg total) by mouth every 4 (four) hours as needed for congestion. Patient not taking: Reported on 07/31/2015 03/30/15   Fayrene HelperBowie Tran, PA-C   BP 145/84 mmHg  Pulse 101  Temp(Src) 98.5 F (36.9 C) (Oral)  Resp 18  SpO2 98%  LMP 07/11/2015 (Approximate) Physical Exam  Constitutional: She is oriented to person, place, and time. She appears well-developed and well-nourished. No distress.  Anxious, tearful.    HENT:  Head: Atraumatic.  Mouth/Throat: Oropharynx is clear and moist.  Eyes: Conjunctivae are normal. Pupils are equal, round, and reactive to light. No scleral icterus.  Neck: Neck supple. No tracheal deviation present.  Cardiovascular: Normal rate, regular rhythm, normal heart sounds and intact distal pulses.   Pulmonary/Chest: Effort normal and breath sounds normal. No respiratory distress.  Abdominal: Soft. Normal appearance. She exhibits no distension. There is no tenderness.  Musculoskeletal: She exhibits no edema.  Left upper arm amputee (remote injury). No edema.   Neurological: She is alert and oriented to person, place, and time.  Speech clear, fluent. Steady gait. No tremor or shakes.   Skin: Skin is warm and dry. No rash noted. She is not diaphoretic.  Psychiatric:  Tearful, depressed.   Nursing note and vitals reviewed.   ED Course  Procedures (including critical care time) Labs Review  Results for orders placed or performed during the hospital encounter of 07/31/15  Comprehensive metabolic panel  Result Value Ref Range   Sodium 133 (L) 135 - 145 mmol/L   Potassium 4.1 3.5 - 5.1 mmol/L   Chloride 102 101 - 111 mmol/L   CO2 24 22 - 32 mmol/L   Glucose, Bld 164 (H) 65 - 99 mg/dL   BUN 11 6 - 20 mg/dL   Creatinine, Ser 1.610.82 0.44 - 1.00 mg/dL   Calcium 8.6 (L) 8.9 - 10.3 mg/dL   Total Protein 7.1 6.5 - 8.1 g/dL   Albumin 3.8 3.5 - 5.0 g/dL   AST 22 15 - 41 U/L   ALT 15 14 - 54 U/L   Alkaline Phosphatase 82 38 - 126 U/L   Total Bilirubin 0.7 0.3 - 1.2 mg/dL   GFR calc non Af Amer >60 >60 mL/min   GFR calc Af Amer >60 >60 mL/min   Anion gap 7 5 - 15  Ethanol  Result Value Ref Range   Alcohol, Ethyl (B) <5 <5 mg/dL  Salicylate level  Result Value Ref Range   Salicylate Lvl <4.0 2.8 - 30.0 mg/dL  Acetaminophen level  Result Value Ref Range   Acetaminophen (Tylenol), Serum <10 (L) 10 - 30 ug/mL  cbc  Result Value Ref Range   WBC 6.7 4.0 - 10.5 K/uL   RBC  4.95 3.87 - 5.11 MIL/uL   Hemoglobin 12.8 12.0 - 15.0 g/dL   HCT 09.639.4 04.536.0 - 40.946.0 %   MCV 79.6 78.0 - 100.0 fL   MCH 25.9 (L) 26.0 - 34.0 pg   MCHC 32.5 30.0 - 36.0 g/dL   RDW 81.116.4 (H) 91.411.5 - 78.215.5 %   Platelets 261 150 - 400 K/uL  Rapid urine drug screen (hospital performed)  Result Value Ref Range   Opiates POSITIVE (A) NONE DETECTED   Cocaine NONE DETECTED NONE DETECTED   Benzodiazepines POSITIVE (A) NONE DETECTED   Amphetamines NONE DETECTED NONE DETECTED   Tetrahydrocannabinol POSITIVE (A) NONE DETECTED   Barbiturates NONE DETECTED NONE DETECTED      I have personally reviewed and evaluated these lab results as part of  my medical decision-making.   MDM   Labs.  Behavioral health team consulted.  Reviewed nursing notes and prior charts for additional history.   Psych holding orders placed.  Disposition per [facility] team.        Cathren Laine, MD 07/31/15 [phone]

## 2015-07-31 NOTE — ED Notes (Signed)
Pt. and belongings wanded by security 

## 2015-07-31 NOTE — Progress Notes (Signed)
[name] is a 50 y.o. female being [facility]  She came to the ED with complaining of suicidal ideation, auditory/visual hallucination, and withdrawal from heroin and marijuana. She is reporting suicidal ideation no plan or intent.  She is also hearing voices encouraging her to hurt herself.  She is seeing shadows as well.  Pt also reported that for the past 5-6 months, she has used marijuana and .5 gram of heroin daily (administered IV).  Last use was 07/30/15.  Pt stated that she is experiencing withdrawal symptoms.  She is also reporting frequent tearfulness, insomnia, poor appetite, persistent and unremitting despondency, hopelessness, fatigue, and feelings of worthlessness.  She is diagnosed with Schizoaffective Disorder, Bipolar Type; Polysubstance Use Disorder.  She currently denies SI/HI or A/V hallucination.  She kept repeating during the assessment, "are you done, I'm tired and want to lay down."  Admission paperwork completed and signed.  Belongings searched and secured in locker # 48.  Skin assessment completed and noted left arm amputated at the elbow and no other skin issues.  Q 15 minute checks initiated for safety.  We will monitor the progress towards her goals.

## 2015-07-31 NOTE — BH Assessment (Signed)
[facility] Assessment Progress Note  Per Nanine MeansJamison Lord, DNP, this pt requires psychiatric hospitalization at this time.  Berneice Heinrichina Tate, RN, [facility].  Pt has signed Voluntary Admission and Consent for Treatment, as well as Consent to Release Information to [facility], her outpatient provider, and a notification call has been placed.  [facility].  Pt's nurse, Jan, has been notified, and agrees to send original paperwork along with pt via [facility] [place], and to call report to [phone].  Doylene Canninghomas Joson Sapp, MA Triage Specialist [phone]

## 2015-08-01 ENCOUNTER — Encounter (HOSPITAL_COMMUNITY): Payer: Self-pay

## 2015-08-01 DIAGNOSIS — F1220 Cannabis dependence, uncomplicated: Secondary | ICD-10-CM

## 2015-08-01 DIAGNOSIS — F1310 Sedative, hypnotic or anxiolytic abuse, uncomplicated: Secondary | ICD-10-CM | POA: Clinically undetermined

## 2015-08-01 DIAGNOSIS — F1120 Opioid dependence, uncomplicated: Secondary | ICD-10-CM

## 2015-08-01 DIAGNOSIS — F250 Schizoaffective disorder, bipolar type: Principal | ICD-10-CM

## 2015-08-01 DIAGNOSIS — S58112A Complete traumatic amputation at level between elbow and wrist, left arm, initial encounter: Secondary | ICD-10-CM | POA: Diagnosis present

## 2015-08-01 MED ORDER — TRAZODONE HCL 100 MG PO TABS
100.0000 mg | ORAL_TABLET | Freq: Every day | ORAL | Status: DC
  Administered 2015-08-01: 100 mg via ORAL
  Filled 2015-08-01 (×3): qty 1

## 2015-08-01 MED ORDER — CHLORDIAZEPOXIDE HCL 25 MG PO CAPS: 25.0000 mg | ORAL_CAPSULE | Freq: Four times a day (QID) | ORAL | Status: DC | PRN

## 2015-08-01 MED ORDER — ADULT MULTIVITAMIN W/MINERALS CH
1.0000 | ORAL_TABLET | Freq: Every day | ORAL | Status: DC
  Administered 2015-08-01 – 2015-08-02 (×2): 1 via ORAL
  Filled 2015-08-01 (×3): qty 1

## 2015-08-01 NOTE — [facility] Group Notes (Signed)
[facility] LCSW Group Therapy  08/01/2015 3:09 PM  Type of Therapy:  Group Therapy  Participation Level:  Minimal  Participation Quality:  Attentive  Affect:  Appropriate  Cognitive:  Appropriate  Insight:  Improving  Engagement in Therapy:  Limited  Modes of Intervention:  Discussion, Education, Exploration, Problem-solving, Rapport Building, Socialization and Support  Summary of Progress/Problems:  Finding Balance in Life. Today's group focused on defining balance in one's own words, identifying things that can knock one off balance, and exploring healthy ways to maintain balance in life. Group members were asked to provide an example of a time when they felt off balance, describe how they handled that situation,and process healthier ways to regain balance in the future. Group members were asked to share the most important tool for maintaining balance that they [facility] and how they plan to apply this method after discharge. [name] was attentive during today's processing group. [name] was resistant to active participation and appeared somewhat anxious in the group setting. [name] shared that "I know what I need to do when I leave here. I need to put the focus on me." [name] shared that she wants to discharge Friday and resume services with her ACTT team. She is also interested in SAIOP through [facility] but changed her mind about looking into methadone maintenance. "My mom did that and it's just something else for me to be addicted to."   Smart, ClarindaHeather LCSW 08/01/2015, 3:09 PM

## 2015-08-01 NOTE — Tx Team (Signed)
Interdisciplinary Treatment Plan Update (Adult)  Date:  08/01/2015  Time Reviewed:  8:27 AM   Progress in Treatment: Attending groups: Yes Participating in groups:  Yes Taking medication as prescribed:  Yes. Tolerating medication:  Yes. Family/Significant othe contact made:  SPE required for pt. Patient understands diagnosis:  Yes. Discussing patient identified problems/goals with staff:  Yes. Medical problems stabilized or resolved:  Yes. Denies suicidal/homicidal ideation: Yes. Issues/concerns per patient self-inventory:  Other:  Discharge Plan or Barriers: CSW assessing for appropriate referrals. During last admission in 2014, pt was set up with [facility] team.   Reason for Continuation of Hospitalization: Depression Hallucinations Medication stabilization Withdrawal symptoms  Comments:  [name] is a 50 y.o. female who presented voluntarily to [facility] with complaint of suicidal ideation, AVH, and withdrawal from heroin and marijuana. Pt has a history of Schizoaffective Disorder, Bipolar Type.). She is also experiencing auditory hallucinations (voices encouraging her to hurt herself) and visual hallucinations (shadows). Pt also reported that for the past 5-6 months, she has used marijuana and .5 gram of heroin daily (administered IV). Last use was 07/30/15. Pt stated that she is experiencing withdrawal symptoms.Pt stated that she lives with her boyfriend but also that "He's gone." When pressed, Pt said she does know where boyfriend is, and she was unclear whether or not he left her or if he is simply away from the home. Pt stated that if she were to leave the hospital, she would not be safe. She said she is afraid she would harm herself.Pt was treated inpatient at [facility] in January 2014. Pt is on disability.  Pt's recent memory was poor -- she could not recall how she came to the hospital, and she was unsure why her live-in boyfriend was no longer at the home. Remote  memory and concentration were intact.Diagnosis: Schizoaffective Disorder, Bipolar Type; Polysubstance Use Disorder  Estimated length of stay:  3-5 days   New goal(s): to develop effective aftercare plan.   Additional Comments:  Patient and CSW reviewed pt's identified goals and treatment plan. Patient verbalized understanding and agreed to treatment plan. CSW reviewed [facility] "Discharge Process and Patient Involvement" Form. Pt verbalized understanding of information provided and signed form.    Review of initial/current patient goals per problem list:  1. Goal(s): Patient will participate in aftercare plan  Met: No.   Target date: at discharge  As evidenced by: Patient will participate within aftercare plan AEB aftercare provider and housing plan at discharge being identified.  7/6: CSW assessing for appropriate referrals.   2. Goal (s): Patient will exhibit decreased depressive symptoms and suicidal ideations.  Met: No.    Target date: at discharge  As evidenced by: Patient will utilize self rating of depression at 3 or below and demonstrate decreased signs of depression or be deemed stable for discharge by MD.  7/6: Pt rates depression as high. Denies SI/HI this morning. Depressed mood/lethargic affect.   3. Goal(s): Patient will demonstrate decreased signs and symptoms of psychosis.  Met:No.   Target date: at discharge  As evidenced by: Patient will report no auditory/visual hallucinations and will demonstrate clear, coherent thinking process.   7/6: Pt reports that she is seeing shadows. Poor insight at this time; pt is poor historian.   4. Goal(s): Patient will demonstrate decreased signs of withdrawal due to substance abuse  Met:No.   Target date:at discharge   As evidenced by: Patient will produce a CIWA/COWS score of 0, have stable vitals signs,  and no symptoms of withdrawal.  7/6: Pt reports moderate withdrawals. She has CIWA of 1 and no COWS  score/stable vitals.   Attendees: Patient:   08/01/2015 8:27 AM   Family:   08/01/2015 8:27 AM   Physician:  Dr. Shea Evans MD; Dr. Parke Poisson MD 08/01/2015 8:27 AM   Nursing:   Brita Romp RN 08/01/2015 8:27 AM   Clinical Social Worker: Maxie Better, LCSW 08/01/2015 8:27 AM   Clinical Social Worker: Erasmo Downer Drinkard LCSW 08/01/2015 8:27 AM   Other:  Agustina Caroli NP 08/01/2015 8:27 AM   Other:    08/01/2015 8:27 AM   Other:   08/01/2015 8:27 AM   Other:  08/01/2015 8:27 AM   Other:  08/01/2015 8:27 AM   Other:  08/01/2015 8:27 AM    08/01/2015 8:27 AM    08/01/2015 8:27 AM    08/01/2015 8:27 AM    08/01/2015 8:27 AM    Scribe for Treatment Team:   Maxie Better, LCSW 08/01/2015 8:27 AM

## 2015-08-01 NOTE — [facility] Suicide Risk Assessment (Signed)
[facility] INPATIENT:  Family/Significant Other Suicide Prevention Education  Suicide Prevention Education:  Patient Refusal for Family/Significant Other Suicide Prevention Education: The patient [name] has refused to provide written consent for family/significant other to be provided Family/Significant Other Suicide Prevention Education during admission and/or prior to discharge.  Physician notified.  SPE completed with pt, as pt refused to consent to family contact. SPI pamphlet provided to pt and pt was encouraged to share information with support network, ask questions, and talk about any concerns relating to SPE. Pt denies access to guns/firearms and verbalized understanding of information provided. Mobile Crisis information also provided to pt.   Smart, Jaelle Campanile LCSW 08/01/2015, 3:30 PM

## 2015-08-01 NOTE — Progress Notes (Addendum)
Patient ID: Caroll RancherLinda N Pugmire, female   DOB: [birth year], 50 y.o.   MRN: [id number]  DAR: Pt. Denies SI/HI and A/V Hallucinations. She reports sleep is good, appetite is good, energy level is normal, and concentration is good. She rates depression, anxiety, and hopelessness 0/10. Patient does report pain 8/10 however refuses intervention at this time. She reports withdrawal symptoms including cravings, diarrhea, and nausea. She received scheduled and PRN medications for this. Support and encouragement provided to the patient. Scheduled medications administered to patient per physician's orders. Patient is irritable at times but cooperative. She reports, "I wanna leave tomorrow. Do y'all give Suboxone or Methadone here?" Patient reports "I wanna do outpatient." Writer notified social worker of patient's interest for discharge. Q15 minute checks are maintained for safety.

## 2015-08-01 NOTE — [facility] Group Notes (Signed)
[facility] Group Notes:  (Nursing/MHT/Case Management/Adjunct)  Date:  08/01/2015  Time:  0900  Type of Therapy: Recovery  :  The group focused on teaching patients the need to change unhealthy behaviors and involved patients identifying what behavior they will change .  Participation Level:  Normal  Participation Quality:  Good  Affect:  Flat  Cognitive:  Intact  Insight:  Intact  Engagement in Group:  Engaged  Modes of Intervention: Discussion   Summary of Progress/Problems:  [name], [name] 08/01/2015, 11:00 AM

## 2015-08-01 NOTE — [facility] Counselor (Addendum)
Adult Comprehensive Assessment  Patient ID: Stacy RancherLinda N Polinski, female   DOB: 1965-10-19, 50 y.o.   MRN: [id number]  Information Source: Information source: Patient  Current Stressors:  Educational / Learning stressors: N/A Employment / Job issues: N/A Family Relationships: N/A Surveyor, quantityinancial / Lack of resources (include bankruptcy): N/A Housing / Lack of housing: N/A Physical health (include injuries & life threatening diseases): none. Social relationships:  Substance abuse: Yes Relapse on cocaine recently Bereavement / Loss: mom recently lost both legs to diabetes. Pt tearful.   Living/Environment/Situation:  Living Arrangements: boyfriend  Living conditions (as described by patient or guardian): comfortable, quiet apartment How long has patient lived in current situation?: 5 years What is atmosphere in current home: Comfortable  Family History:  Marital status: long term relationship. 6 years; boyfriend does not use drugs/alcohol. Does patient have children?: No  Childhood History:  By whom was/is the patient raised?: Mother Additional childhood history information: Father never in the picture Description of patient's relationship with caregiver when they were a child: Good with mother Patient's description of current relationship with people who raised him/her: Good with mother Does patient have siblings?: Yes Number of Siblings: 1  Description of patient's current relationship with siblings: Brother-good Did patient suffer any verbal/emotional/physical/sexual abuse as a child?: No Did patient suffer from severe childhood neglect?: No Has patient ever been sexually abused/assaulted/raped as an adolescent or adult?: No Was the patient ever a victim of a crime or a disaster?: No Witnessed domestic violence?: No Has patient been effected by domestic violence as an adult?: No  Education:  Highest grade of school patient has completed: 7th Currently a Consulting civil engineerstudent?: No Learning  disability?: Yes What learning problems does patient have?: Unsure  Employment/Work Situation:  Employment situation: On disability Why is patient on disability: Mental health How long has patient been on disability: In her late 20's What is the longest time patient has a held a job?: 2 years 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 Has patient ever been in the [employer] and Companymilitary?: No Has patient ever served in combat?: No  Financial Resources:  Financial resources: Insurance claims handlereceives SSDI Does patient have a Lawyerrepresentative payee or guardian?: No  Alcohol/Substance Abuse:  What has been your use of drugs/alcohol within the last 12 months?: heroin (smoke) $100 daily on average 3months ago; alcohol use for several years. (2-3 beers per day).  Alcohol/Substance Abuse Treatment Hx: Past Tx, Inpatient If yes, describe treatment[facility]-been seeing patient for almost 4 years.  Has alcohol/substance abuse ever caused legal problems?: No  Social Support System:  Patient's Community Support System: Good Describe Community Support System: Family Type of faith/religion: Baptist How does patient's faith help to cope with current illness?: Makes me stronger  Leisure/Recreation:  Leisure and Hobbies: take care of God kids  Strengths/Needs:  What things does the patient do well?: clean, organize, take care of kids In what areas does patient struggle / problems for patient: None  Discharge Plan:  Does patient have access to transportation?: Yes Will patient be returning to same living situation after discharge?: Yes Currently receiving community mental health services: Yes (From Whom) ([facility]) and [facility] for substance abuse IOP.  Does patient have financial barriers related to discharge medications?: disability and medicaid.            Summary/Recommendations:   Summary and Recommendations (to be  completed by the evaluator): Patient is 50 year old [place] with her boyfriend. She has a diagnosis of Schizoaffective Disorder and Opiate Use Disorder, severe. Patient presents to the hospital seeking treatment for SI, depression, AVH, heroin abuse, alcohol abuse, and for medication stabilization. Patient reports that she has been off medication for about 3 days prior to admission. She sees [facility] and plans to resume services with them at discharge. Patient is also interested in [facility] for SAI[facility] and possibly methadone maintainence. Recommendations for patient include: crisis stabilization, therapeutic milieu, encourage group attendance and participation, medication management for withdrawals/mood instability, and to develop comprehensive mental wellness/sobriety plan.   Smart, Tikita Mabee LCSW 08/01/2015 10:57 AM

## 2015-08-01 NOTE — H&P (Signed)
Psychiatric Admission Assessment Adult  Patient Identification: [name]  MRN:  [id number]  Date of Evaluation:  08/01/2015  Chief Complaint:  schizoaffective disorder-bipolar type polysubstance use disorder, Suicidal ideations, auditory visual hallucinations.  Principal Diagnosis: Opioid use disorder, severe, dependence, Schizoaffective disorder, bipolar type ([place])  Diagnosis:   Patient Active Problem List   Diagnosis Date Noted  . Opioid use disorder, severe, dependence ([place]) [F11.20] 08/01/2015    Priority: High  . Schizoaffective disorder, bipolar type ([place]) [F25.0] 07/31/2015    Priority: Medium  . Amputation of arm below elbow, left [facility]) [R97.588T, G54.982M] 08/01/2015    Priority: Low  . Opioid use disorder, moderate, dependence ([place]) [F11.20] 08/01/2015  . Benzodiazepine abuse [F13.10] 08/01/2015  . Cannabis use disorder, moderate, dependence ([place]) [F12.20] 08/01/2015  . Grief reaction [F43.21] 03/09/2015  . Dysuria [R30.0] 10/09/2014  . Encounter for long-term (current) use of medications [Z79.899] 06/27/2013  . Screening examination for venereal disease [Z11.3] 06/27/2013  . ESSENTIAL HYPERTENSION, BENIGN [I10] 05/18/2008  . BACK STRAIN, LUMBAR [S33.5XXA] 11/09/2007  . MENOPAUSE-RELATED VASOMOTOR SYMPTOMS, HOT FLASHES [N95.1] 08/24/2006  . Human immunodeficiency virus (HIV) disease ([place]) [B20] 05/06/2006  . HERPES ZOSTER [B02.9] 05/06/2006   History of Present Illness: This is an admission assessment for this 50 year old African-American female with hx of Schizoaffective disorder, bipolar-type. Admitted to the [facility] adult unit form the [facility] long hospital with complaints of suicidal ideations, auditory, visual hallucinations & opioid withdrawal symptoms. She is here for opioid detoxification as well as mood stabilization treatments. During this admission assessment, [name] reports, "My uncle took me to the [facility] ED yesterday because I was feeling very  depressed, hearing voices, seeing things & I felt like hurting myself. I did not try to hurt myself, but I had in the past. I just can't remember what I did back then. I have been feeling very depressed since my mother's legs were amputated 3 months ago. She has diabetes. But, I have been using drugs x 4 months due to my bad depression. I have spent $4,000.00 on heroin alone in 4 months. I feel really sad for the kind of life I've been living. I was diagnosed with HIV in 2005. I'm not on medications yet because my doctor told me that my T-cells are still high. I have Bipolar & Schizophrenia. I'm on medications for it. I see Dr. Percell Belt, he is with my Act Team. I'm feeling a lot better now. Can I be discharged tomorrow?.  Associated Signs/Symptoms:  Depression Symptoms:  depressed mood, insomnia, feelings of worthlessness/guilt, anxiety,  (Hypo) Manic Symptoms:  Impulsivity, Labiality of Mood,  Anxiety Symptoms:  Excessive Worry,  Psychotic Symptoms:  Hallucinations: Auditory Visual  PTSD Symptoms: Denies any PTSD symptoms.  Total Time spent with patient: 1 hour  Past Psychiatric History: Schizoaffective disorder  Is the patient at risk to self? No.  Has the patient been a risk to self in the past 6 months? No.  Has the patient been a risk to self within the distant past? Yes.    Is the patient a risk to others? No.  Has the patient been a risk to others in the past 6 months? No.  Has the patient been a risk to others within the distant past? No.   Prior Inpatient Therapy: Yes ([place]). Prior Outpatient Therapy: Yes, (Has an ACT TEAM)  Alcohol Screening: 1. How often do you have a drink containing alcohol?: 4 or more times a week 2. How many drinks  containing alcohol do you have on a typical day when you are drinking?: 7, 8, or 9 3. How often do you have six or more drinks on one occasion?: Weekly Preliminary Score: 6 4. How often during the last year have you found that you  were not able to stop drinking once you had started?: Daily or almost daily 5. How often during the last year have you failed to do what was normally expected from you becasue of drinking?: Daily or almost daily 6. How often during the last year have you needed a first drink in the morning to get yourself going after a heavy drinking session?: Weekly 7. How often during the last year have you had a feeling of guilt of remorse after drinking?: Daily or almost daily 8. How often during the last year have you been unable to remember what happened the night before because you had been drinking?: Daily or almost daily 9. Have you or someone else been injured as a result of your drinking?: No 10. Has a relative or friend or a doctor or another health worker been concerned about your drinking or suggested you cut down?: Yes, during the last year Alcohol Use Disorder Identification Test Final Score (AUDIT): 33 Brief Intervention: Patient declined brief intervention  Substance Abuse History in the last 12 months:  Yes.    Consequences of Substance Abuse: Medical Consequences:  Liver damage, Possible death by overdose Legal Consequences:  Arrests, jail time, Loss of driving privilege. Family Consequences:  Family discord, divorce and or separation.  Previous Psychotropic Medications: Yes, Seroquel, Prozac  Psychological Evaluations: Yes   Past Medical History:  Past Medical History  Diagnosis Date  . Bipolar disorder ([place])   . Schizoaffective disorder   . Suicide and self-inflicted injury ([place])   . Hypertension   . Traumatic brain injury ([place])   . Substance abuse   . HIV (human immunodeficiency virus infection) [facility])     Past Surgical History  Procedure Laterality Date  . 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 Bowel obstruction    . Surgery r/t bowel obstruction     Family History:  Family History  Problem Relation Age of Onset  . Diabetes Mother   . Mental illness Neg Hx     Family  Psychiatric  History: Denies any family hx of mental illness or substance abuse/dependence.  Tobacco Screening: Smokes about 1/2 packs of cigarette daily  Social History:  History  Alcohol Use No    Comment: 2-3 beers     History  Drug Use No    Additional Social History: Pain Medications: See PTA Prescriptions: See PTA Over the Counter: See PTA History of alcohol / drug use?: Yes Longest period of sobriety (when/how long): 2 years, ended 2009 Negative Consequences of Use: Personal relationships Withdrawal Symptoms: Tremors Name of Substance 1: Heroin 1 - Age of First Use: unknown 1 - Amount (size/oz): $100.00 1 - Frequency: Daily 1 - Duration: 5-6 months 1 - Last Use / Amount: 07/30/15 Name of Substance 2: Marijuana 2 - Age of First Use: 18 2 - Amount (size/oz): $400 2 - Duration: 5/-6 months 2 - Last Use / Amount: 07/30/15 Name of Substance 3: Benzos 3 - Age of First Use: unknown 3 - Amount (size/oz): 41m xanax  3 - Frequency: twice per day 3 - Duration: "awhile" 3 - Last Use / Amount: 07/30/15  Allergies:   Allergies  Allergen Reactions  . Ibuprofen Itching  . Lithium Other (See  Comments)    Severe muscle reaction  . Risperidone And Related Other (See Comments)    Muscle spasms  . Thorazine [Chlorpromazine] Other (See Comments)    Severe muscle reaction   Lab Results:  Results for orders placed or performed during the hospital encounter of 07/31/15 (from the past 48 hour(s))  Comprehensive metabolic panel     Status: Abnormal   Collection Time: 07/31/15  8:42 AM  Result Value Ref Range   Sodium 133 (L) 135 - 145 mmol/L   Potassium 4.1 3.5 - 5.1 mmol/L   Chloride 102 101 - 111 mmol/L   CO2 24 22 - 32 mmol/L   Glucose, Bld 164 (H) 65 - 99 mg/dL   BUN 11 6 - 20 mg/dL   Creatinine, Ser 0.82 0.44 - 1.00 mg/dL   Calcium 8.6 (L) 8.9 - 10.3 mg/dL   Total Protein 7.1 6.5 - 8.1 g/dL   Albumin 3.8 3.5 - 5.0 g/dL   AST 22 15 - 41 U/L   ALT 15 14 - 54 U/L   Alkaline  Phosphatase 82 38 - 126 U/L   Total Bilirubin 0.7 0.3 - 1.2 mg/dL   GFR calc non Af Amer >60 >60 mL/min   GFR calc Af Amer >60 >60 mL/min    Comment: (NOTE) The eGFR has been calculated using the CKD EPI equation. This calculation has not been validated in all clinical situations. eGFR's persistently <60 mL/min signify possible Chronic Kidney Disease.    Anion gap 7 5 - 15  Ethanol     Status: None   Collection Time: 07/31/15  8:42 AM  Result Value Ref Range   Alcohol, Ethyl (B) <5 <5 mg/dL    Comment:        LOWEST DETECTABLE LIMIT FOR SERUM ALCOHOL IS 5 mg/dL FOR MEDICAL PURPOSES ONLY   Salicylate level     Status: None   Collection Time: 07/31/15  8:42 AM  Result Value Ref Range   Salicylate Lvl <6.0 2.8 - 30.0 mg/dL  Acetaminophen level     Status: Abnormal   Collection Time: 07/31/15  8:42 AM  Result Value Ref Range   Acetaminophen (Tylenol), Serum <10 (L) 10 - 30 ug/mL    Comment:        THERAPEUTIC CONCENTRATIONS VARY SIGNIFICANTLY. A RANGE OF 10-30 ug/mL MAY BE AN EFFECTIVE CONCENTRATION FOR MANY PATIENTS. HOWEVER, SOME ARE BEST TREATED AT CONCENTRATIONS OUTSIDE THIS RANGE. ACETAMINOPHEN CONCENTRATIONS >150 ug/mL AT 4 HOURS AFTER INGESTION AND >50 ug/mL AT 12 HOURS AFTER INGESTION ARE OFTEN ASSOCIATED WITH TOXIC REACTIONS.   cbc     Status: Abnormal   Collection Time: 07/31/15  8:42 AM  Result Value Ref Range   WBC 6.7 4.0 - 10.5 K/uL   RBC 4.95 3.87 - 5.11 MIL/uL   Hemoglobin 12.8 12.0 - 15.0 g/dL   HCT 39.4 36.0 - 46.0 %   MCV 79.6 78.0 - 100.0 fL   MCH 25.9 (L) 26.0 - 34.0 pg   MCHC 32.5 30.0 - 36.0 g/dL   RDW 16.4 (H) 11.5 - 15.5 %   Platelets 261 150 - 400 K/uL  Rapid urine drug screen (hospital performed)     Status: Abnormal   Collection Time: 07/31/15  9:09 AM  Result Value Ref Range   Opiates POSITIVE (A) NONE DETECTED   Cocaine NONE DETECTED NONE DETECTED   Benzodiazepines POSITIVE (A) NONE DETECTED   Amphetamines NONE DETECTED NONE  DETECTED   Tetrahydrocannabinol POSITIVE (A) NONE DETECTED  Barbiturates NONE DETECTED NONE DETECTED    Comment:        DRUG SCREEN FOR MEDICAL PURPOSES ONLY.  IF CONFIRMATION IS NEEDED FOR ANY PURPOSE, NOTIFY LAB WITHIN 5 DAYS.        LOWEST DETECTABLE LIMITS FOR URINE DRUG SCREEN Drug Class       Cutoff (ng/mL) Amphetamine      1000 Barbiturate      200 Benzodiazepine   628 Tricyclics       366 Opiates          300 Cocaine          300 THC              50     Blood Alcohol level:  Lab Results  Component Value Date   ETH <5 07/31/2015   ETH <11 29/47/6546   Metabolic Disorder Labs:  No results found for: HGBA1C, MPG No results found for: PROLACTIN Lab Results  Component Value Date   CHOL 159 02/22/2015   TRIG 79 02/22/2015   HDL 52 02/22/2015   CHOLHDL 3.1 02/22/2015   VLDL 16 02/22/2015   LDLCALC 91 02/22/2015   LDLCALC 112* 12/14/2013   Current Medications: Current Facility-Administered Medications  Medication Dose Route Frequency Provider Last Rate Last Dose  . acetaminophen (TYLENOL) tablet 650 mg  650 mg Oral Q4H PRN Patrecia Pour, NP   650 mg at 08/01/15 0758  . alum & mag hydroxide-simeth (MAALOX/MYLANTA) 200-200-20 MG/5ML suspension 30 mL  30 mL Oral Q4H PRN Patrecia Pour, NP      . chlordiazePOXIDE (LIBRIUM) capsule 25 mg  25 mg Oral QID PRN Encarnacion Slates, NP      . cloNIDine (CATAPRES) tablet 0.1 mg  0.1 mg Oral QID Patrecia Pour, NP   0.1 mg at 08/01/15 1207   Followed by  . [START ON 08/02/2015] cloNIDine (CATAPRES) tablet 0.1 mg  0.1 mg Oral BH-qamhs Patrecia Pour, NP       Followed by  . [START ON 08/05/2015] cloNIDine (CATAPRES) tablet 0.1 mg  0.1 mg Oral QAC breakfast Patrecia Pour, NP      . dicyclomine (BENTYL) tablet 20 mg  20 mg Oral Q6H PRN Patrecia Pour, NP      . FLUoxetine (PROZAC) capsule 20 mg  20 mg Oral Daily Patrecia Pour, NP   20 mg at 08/01/15 0755  . hydrOXYzine (ATARAX/VISTARIL) tablet 25 mg  25 mg Oral Q6H PRN Patrecia Pour, NP   25 mg at 08/01/15 0439  . loperamide (IMODIUM) capsule 2-4 mg  2-4 mg Oral PRN Patrecia Pour, NP   4 mg at 08/01/15 0758  . magnesium hydroxide (MILK OF MAGNESIA) suspension 30 mL  30 mL Oral Daily PRN Patrecia Pour, NP      . methocarbamol (ROBAXIN) tablet 500 mg  500 mg Oral Q8H PRN Patrecia Pour, NP   500 mg at 08/01/15 0439  . multivitamin with minerals tablet 1 tablet  1 tablet Oral Daily Encarnacion Slates, NP   1 tablet at 08/01/15 1207  . nicotine (NICODERM CQ - dosed in mg/24 hours) patch 14 mg  14 mg Transdermal Daily Jenne Campus, MD   14 mg at 08/01/15 0755  . ondansetron (ZOFRAN-ODT) disintegrating tablet 4 mg  4 mg Oral Q6H PRN Patrecia Pour, NP   4 mg at 08/01/15 0758  . QUEtiapine (SEROQUEL) tablet 200 mg  200 mg Oral QID  Patrecia Pour, NP   200 mg at 08/01/15 1207  . traZODone (DESYREL) tablet 100 mg  100 mg Oral QHS Encarnacion Slates, NP       PTA Medications: Prescriptions prior to admission  Medication Sig Dispense Refill Last Dose  . FLUoxetine (PROZAC) 20 MG capsule Take 1 capsule (20 mg total) by mouth daily. For depression. 30 capsule 0 07/30/2015 at Unknown time  . QUEtiapine (SEROQUEL) 200 MG tablet Take 200 mg by mouth 4 (four) times daily.  0 07/31/2015 at Unknown time   Musculoskeletal: Strength & Muscle Tone: within normal limits Gait & Station: normal Patient leans: N/A  Psychiatric Specialty Exam: Physical Exam  Constitutional: She is oriented to person, place, and time. She appears well-developed.  HENT:  Head: Normocephalic.  Eyes: Pupils are equal, round, and reactive to light.  Cardiovascular: Normal rate.   Respiratory: Effort normal.  GI: Soft.  Genitourinary:  Denies any issues in this area  Musculoskeletal: Normal range of motion.  Neurological: She is alert and oriented to person, place, and time.  Skin: Skin is warm and dry.  Psychiatric: Her speech is normal. Thought content normal. Her mood appears anxious. Her affect is not angry,  not blunt, not labile and not inappropriate. She is actively hallucinating. Cognition and memory are normal. She expresses impulsivity. She exhibits a depressed mood.    Review of Systems  Constitutional: Positive for chills, malaise/fatigue and diaphoresis.  HENT: Negative.   Eyes: Negative.   Respiratory: Negative.   Cardiovascular: Negative.   Gastrointestinal: Positive for nausea and abdominal pain.  Genitourinary: Negative.   Musculoskeletal: Positive for myalgias and joint pain.  Skin: Negative.   Neurological: Negative.   Endo/Heme/Allergies: Negative.   Psychiatric/Behavioral: Positive for depression, suicidal ideas, hallucinations and substance abuse (Opioid use disorder). Negative for memory loss. The patient is nervous/anxious and has insomnia.     Blood pressure 117/76, pulse 76, temperature 98.4 F (36.9 C), temperature source Oral, resp. rate 18, height '5\' 4"'  (1.626 m), weight 109.77 kg (242 lb), last menstrual period 07/11/2015, SpO2 98 %.Body mass index is 41.52 kg/(m^2).  General Appearance: Casual, poor dentition (missing teeth), left arm stump.  Eye Contact:  Good  Speech:  Clear and Coherent  Volume:  Normal  Mood:  Anxious and Depressed  Affect:  Restricted  Thought Process:  Coherent and Goal Directed  Orientation:  Full (Time, Place, and Person)  Thought Content:  Ruminations, auditory, visual hallucinations.  Suicidal Thoughts:  Still present, fleeting thoughts, denies any plans or intent, able to contarct for safety.  Homicidal Thoughts:  Denies thoughts, intent or plans.  Memory:  Immediate;   Good Recent;   Good Remote;   Fair  Judgement:  Fair  Insight:  Fair  Psychomotor Activity:  Normal  Concentration:  Concentration: Fair and Attention Span: Fair  Recall:  [employer] of Knowledge:  Limited  Language:  Good  Akathisia:  Negative  Handed:  Right  AIMS (if indicated):     Assets:  Communication Skills Desire for Improvement  ADL's:  Intact   Cognition:  WNL  Sleep:  Number of Hours: 5   Treatment Plan Summary: Daily contact with patient to assess and evaluate symptoms and progress in treatment and Medication management: Treatment Plan/Recommendations: 1. Admit for crisis management and stabilization, estimated length of stay 3-5 days.  2. Medication management to reduce current symptoms to base line and improve the patient's overall level of functioning: Will continue the Clonidine detox protocols,  Fluoxetine 20 mg for depression, Seroquel 200 mg for Schizoaffective disorder, Trazodone 100 mg for insomnia, initiate Librium 25 mg for Benzodiazepine detox. 3. Treat health problems as indicated.  4. Develop treatment plan to decrease risk of relapse upon discharge and the need for readmission.  5. Psycho-social education regarding relapse prevention and self care.  6. Health care follow up as needed for medical problems.  7. Review, reconcile, and reinstate any pertinent home medications for other health issues where appropriate. 8. Call for consults with hospitalist for any additional specialty patient care services as needed.  Observation Level/Precautions:  15 minute checks  Laboratory:  Per ED, UDS positive for Opioid, benzodiazepine & THC  Psychotherapy: Group sessions   Medications: Will continue the Clonidine detox protocols, Fluoxetine 20 mg for depression, Seroquel 200 mg for Schizoaffective disorder, Trazodone 100 mg for insomnia, initiate Librium 25 mg qid prn for Benzodiazepine detox.  Consultations: As needed    Discharge Concerns: Safety, maintaining sobriety, mood stabilization  Estimated LOS: 2-4 days  Other: Admit to [id number]   I certify that inpatient services furnished can reasonably be expected to improve the patient's condition.    Encarnacion Slates, NP, PMHNP, FNP-BC. 7/6/201712:08 PM

## 2015-08-01 NOTE — [facility] Suicide Risk Assessment (Signed)
[facility]   Nursing information obtained from:  Patient Demographic factors:  Low socioeconomic status, Unemployed Current Mental Status:  Suicidal ideation indicated by patient Loss Factors:  Loss of significant relationship, Financial problems / change in socioeconomic status Historical Factors:  Prior suicide attempts, Impulsivity Risk Reduction Factors:  NA  Total Time spent with patient: 30 minutes Principal Problem: Schizoaffective disorder, bipolar type ([facility]) Diagnosis:   Patient Active Problem List   Diagnosis Date Noted  . Opioid use disorder, moderate, dependence ([facility]) [F11.20] 08/01/2015  . Benzodiazepine abuse [F13.10] 08/01/2015  . Cannabis use disorder, moderate, dependence ([facility]) [F12.20] 08/01/2015  . Amputation of arm below elbow, left ([facility]) [Z61.096E[S58.112A, A54.098J]S68.412A] 08/01/2015  . Schizoaffective disorder, bipolar type ([facility]) [F25.0] 07/31/2015  . Grief reaction [F43.21] 03/09/2015  . Dysuria [R30.0] 10/09/2014  . Encounter for long-term (current) use of medications [Z79.899] 06/27/2013  . Screening examination for venereal disease [Z11.3] 06/27/2013  . ESSENTIAL HYPERTENSION, BENIGN [I10] 05/18/2008  . BACK STRAIN, LUMBAR [S33.5XXA] 11/09/2007  . MENOPAUSE-RELATED VASOMOTOR SYMPTOMS, HOT FLASHES [N95.1] 08/24/2006  . Human immunodeficiency virus (HIV) disease ([facility]) [B20] 05/06/2006  . HERPES ZOSTER [B02.9] 05/06/2006   Subjective Data: Patient with several psychosocial stressors as well as opioid abuse - presents with SI . Pt also has a hx of schizoaffective do.Pt is motivated to get help with her substance abuse as well as her mental illness.   Continued Clinical Symptoms:  Alcohol Use Disorder Identification Test Final Score (AUDIT): 33 The "Alcohol Use Disorders Identification Test", Guidelines for Use in Primary Care, Second Edition.  World Science writerHealth Organization [facility](WHO). Score between 0-7:  no or low risk or alcohol related problems. Score  between 8-15:  moderate risk of alcohol related problems. Score between 16-19:  high risk of alcohol related problems. Score 20 or above:  warrants further diagnostic evaluation for alcohol dependence and treatment.   CLINICAL FACTORS:   Alcohol/Substance Abuse/Dependencies Previous Psychiatric Diagnoses and Treatments   Musculoskeletal: Strength & Muscle Tone: within normal limits Gait & Station: normal Patient leans: N/A  Psychiatric Specialty Exam: Physical Exam  Nursing note and vitals reviewed.   Review of Systems  Psychiatric/Behavioral: Positive for depression and substance abuse. The patient is nervous/anxious and has insomnia.   All other systems reviewed and are negative.   Blood pressure 112/73, pulse 79, temperature 98.4 F (36.9 C), temperature source Oral, resp. rate 18, height 5\' 4"  (1.626 m), weight 109.77 kg (242 lb), last menstrual period 07/11/2015, SpO2 98 %.Body mass index is 41.52 kg/(m^2).  General Appearance: Guarded  Eye Contact:  Fair  Speech:  Clear and Coherent  Volume:  Decreased  Mood:  Anxious, Depressed and Dysphoric  Affect:  Congruent  Thought Process:  Goal Directed and Descriptions of Associations: Intact  Orientation:  Full (Time, Place, and Person)  Thought Content:  Rumination  Suicidal Thoughts:  yes on admission - contracts for safety on the unit  Homicidal Thoughts:  No  Memory:  Immediate;   Fair Recent;   Fair Remote;   Fair  Judgement:  Impaired  Insight:  Shallow  Psychomotor Activity:  Restlessness  Concentration:  Concentration: Fair and Attention Span: Fair  Recall:  [employer]  Fund of Knowledge:  Fair  Language:  Fair  Akathisia:  No  Handed:  Right  AIMS (if indicated):     Assets:  Desire for Improvement  ADL's:  Intact  Cognition:  WNL  Sleep:  Number of Hours: 5      COGNITIVE FEATURES  THAT CONTRIBUTE TO RISK:  Closed-mindedness, Polarized thinking and Thought constriction (tunnel vision)    SUICIDE RISK:    Moderate:  Frequent suicidal ideation with limited intensity, and duration, some specificity in terms of plans, no associated intent, good self-control, limited dysphoria/symptomatology, some risk factors present, and identifiable protective factors, including available and accessible social support.  PLAN OF CARE: Case discussed with Aggie NP. Please see H&P.  I certify that inpatient services furnished can reasonably be expected to improve the patient's condition.   Ksean Vale, MD 08/01/2015, 11:42 AM

## 2015-08-02 MED ORDER — TRAZODONE HCL 100 MG PO TABS: 100.0000 mg | ORAL_TABLET | Freq: Every day | ORAL | Status: AC

## 2015-08-02 MED ORDER — QUETIAPINE FUMARATE 200 MG PO TABS: 200.0000 mg | ORAL_TABLET | Freq: Four times a day (QID) | ORAL | Status: AC

## 2015-08-02 MED ORDER — FLUOXETINE HCL 20 MG PO CAPS: 20.0000 mg | ORAL_CAPSULE | Freq: Every day | ORAL | Status: AC

## 2015-08-02 MED ORDER — NICOTINE 14 MG/24HR TD PT24: 14.0000 mg | MEDICATED_PATCH | Freq: Every day | TRANSDERMAL | Status: DC

## 2015-08-02 MED ORDER — HYDROXYZINE HCL 25 MG PO TABS
25.0000 mg | ORAL_TABLET | Freq: Four times a day (QID) | ORAL | Status: AC | PRN
Start: 2015-08-02 — End: ?

## 2015-08-02 NOTE — Progress Notes (Addendum)
[name] is prepared for DC by this wirter. She is given the Dc AVS, MD SRA, suicide safety plan  and MD transition form. She completes her daily assessment and on it she wrote she denied SI today and she rated her depression, hopelessness and anxeity " 5/0/7", respectively. All belongings are returned to her and her ACT team arrives to pick her up and transport her home and she is discahrged..[name]

## 2015-08-02 NOTE — Progress Notes (Signed)
  [facility] Discharge Plan :  Will you be returning to the same living situation after discharge:  Yes,  return home At discharge, do you have transportation home?: Yes,  ACT team will take you home this afternoon Do you have the ability to pay for your medications: Yes,  [facility]  Release of information consent forms completed and in the chart;  Patient's signature needed at discharge.  Patient to Follow up at: Follow-up Information    Follow up with [facility] .   Why:  Per Fannie KneeSue on ACT team, and ACT member will pick you up at approximately 1:30PM today at discharge.    Contact information:   [address] [place], [place] [address] Phone: [phone] Fax: [phone]      [facility] with [facility]   Why:  Walk in hours Monday, Wednesday, Friday between 9am-11:30am for assessment (Substance Abuse Intensive Outpatient Group). Please bring Medicaid card and photo ID. Thank you.    Contact information:   [address] [place], [place] [address] Phone: [phone] Fax: [phone]      Next level of care provider has access to [facility]:No.   Safety Planning and Suicide Prevention discussed: Yes,  SPE completed with pt; pt declined to consent to family contact. SPI pamphlet and Mobile Crisis information provided to pt and she was encouraged to share information with support network.  Have you used any form of tobacco in the last 30 days? (Cigarettes, Smokeless Tobacco, Cigars, and/or Pipes): Yes  Has patient been referred to the Quitline?: Patient refused referral  Patient has been referred for addiction treatment: Yes  Smart, Eldwin Volkov LCSW 08/02/2015, 9:37 AM

## 2015-08-02 NOTE — Progress Notes (Signed)
D Pt. Denies SI and HI, no complaints of pain or discomfort noted at present time.  A Writer offered support and encouragement, discussed pt.'s day.  R Pt. Rated her day a 9, her depression a 5 and her anxiety a 9.  Pt. Was very anxious this PM requiring vistaril in addition to her HS medications.  Pt. Is presently resting quietly and remains safe on the unit.

## 2015-08-02 NOTE — Tx Team (Signed)
Interdisciplinary Treatment Plan Update (Adult)  Date:  08/02/2015  Time Reviewed:  9:40 AM   Progress in Treatment: Attending groups: Yes Participating in groups:  Yes Taking medication as prescribed:  Yes. Tolerating medication:  Yes. Family/Significant othe contact made:  SPE completed with pt; pt declined to consent to family contact.  Patient understands diagnosis:  Yes. Discussing patient identified problems/goals with staff:  Yes. Medical problems stabilized or resolved:  Yes. Denies suicidal/homicidal ideation: Yes. Issues/concerns per patient self-inventory:  Other:  Discharge Plan or Barriers: Pt plans to resume services with [facility] and they will pick her up at discharge today. Pt given information to [facility] and [facility] for additional support.   Reason for Continuation of Hospitalization: none  Comments:  [name] is a 50 y.o. female who presented voluntarily to [facility] with complaint of suicidal ideation, AVH, and withdrawal from heroin and marijuana. Pt has a history of Schizoaffective Disorder, Bipolar Type.). She is also experiencing auditory hallucinations (voices encouraging her to hurt herself) and visual hallucinations (shadows). Pt also reported that for the past 5-6 months, she has used marijuana and .5 gram of heroin daily (administered IV). Last use was 07/30/15. Pt stated that she is experiencing withdrawal symptoms.Pt stated that she lives with her boyfriend but also that "He's gone." When pressed, Pt said she does know where boyfriend is, and she was unclear whether or not he left her or if he is simply away from the home. Pt stated that if she were to leave the hospital, she would not be safe. She said she is afraid she would harm herself.Pt was treated inpatient at [facility] in January 2014. Pt is on disability.  Pt's recent memory was poor -- she could not recall how she came to the hospital, and she was unsure why her live-in  boyfriend was no longer at the home. Remote memory and concentration were intact.Diagnosis: Schizoaffective Disorder, Bipolar Type; Polysubstance Use Disorder  Estimated length of stay:  D/c today  Additional Comments:  Patient and CSW reviewed pt's identified goals and treatment plan. Patient verbalized understanding and agreed to treatment plan. CSW reviewed [facility] "Discharge Process and Patient Involvement" Form. Pt verbalized understanding of information provided and signed form.    Review of initial/current patient goals per problem list:  1. Goal(s): Patient will participate in aftercare plan  Met: Yes   Target date: at discharge  As evidenced by: Patient will participate within aftercare plan AEB aftercare provider and housing plan at discharge being identified.  7/6: CSW assessing for appropriate referrals.   7/7: Pt plans to return home and resume services with [facility].   2. Goal (s): Patient will exhibit decreased depressive symptoms and suicidal ideations.  Met: Yes    Target date: at discharge  As evidenced by: Patient will utilize self rating of depression at 3 or below and demonstrate decreased signs of depression or be deemed stable for discharge by MD.  7/6: Pt rates depression as high. Denies SI/HI this morning. Depressed mood/lethargic affect.   7/7: Pt bright and pleasant this morning .Reports no SI/HI/AVH and reports decreased depression. "I'm ready to go home today."   3. Goal(s): Patient will demonstrate decreased signs and symptoms of psychosis.  Met:Yes   Target date: at discharge  As evidenced by: Patient will report no auditory/visual hallucinations and will demonstrate clear, coherent thinking process.   7/6: Pt reports that she is seeing shadows. Poor insight at this time; pt  is poor historian.   7/7:Pt reports no AVH. Pleasant/calm and oriented. No confusion noted.   4. Goal(s): Patient will demonstrate decreased signs of  withdrawal due to substance abuse  Met:Yes  Target date:at discharge   As evidenced by: Patient will produce a CIWA/COWS score of 0, have stable vitals signs, and no symptoms of withdrawal.  7/6: Pt reports moderate withdrawals. She has CIWA of 1 and no COWS score/stable vitals.   7/7: Pt reports no signs of withdrawal with no CIWA/COWs today and stable vitals.   Attendees: Patient:   08/02/2015 9:40 AM   Family:   08/02/2015 9:40 AM   Physician:  Dr. Shea Evans MD; Dr. Parke Poisson MD 08/02/2015 9:40 AM   Nursing:   Chong Sicilian; Christa RN  08/02/2015 9:40 AM   Clinical Social Worker: [place], LCSW 08/02/2015 9:40 AM   Clinical Social Worker: Erasmo Downer Drinkard LCSW 08/02/2015 9:40 AM   Other:  Agustina Caroli NP 08/02/2015 9:40 AM   Other:    08/02/2015 9:40 AM   Other:   08/02/2015 9:40 AM   Other:  08/02/2015 9:40 AM   Other:  08/02/2015 9:40 AM   Other:  08/02/2015 9:40 AM    08/02/2015 9:40 AM    08/02/2015 9:40 AM    08/02/2015 9:40 AM    08/02/2015 9:40 AM    Scribe for Treatment Team:   Maxie Better, LCSW 08/02/2015 9:40 AM

## 2015-08-02 NOTE — Progress Notes (Signed)
Recreation Therapy Notes  Date: 07.07.2017 Time: 9:30am Location: [address] Group Room   Group Topic: Stress Management  Goal Area(s) Addresses:  Patient will actively participate in stress management techniques presented during session.   Behavioral Response: Engaged, Attentive, Appropriate   Intervention: Stress management techniques  Activity :  Mindfulness, Mindful Breathing and Progressive Body Scan. LRT provided education, instruction and demonstration on practice of Mindfulness, Mindful Breathing and Progressive Body Scan. Patient was asked to participate in technique introduced during session.   Education:  Stress Management, Discharge Planning.   Education Outcome: Acknowledges education  Clinical Observations/Feedback: Patient actively engaged in technique introduced, expressed no concerns and demonstrated ability to practice independently post d/c.   Kendale Rembold L Cobain Morici, LRT/CTRS        Keisuke Hollabaugh L 08/02/2015 11:51 AM 

## 2015-08-02 NOTE — [facility] Suicide Risk Assessment (Signed)
[facility]   Principal Problem: Schizoaffective disorder, bipolar type [facility](HCC) Discharge Diagnoses:  Patient Active Problem List   Diagnosis Date Noted  . Opioid use disorder, moderate, dependence (HCC) [F11.20] 08/01/2015  . Benzodiazepine abuse [F13.10] 08/01/2015  . Cannabis use disorder, moderate, dependence (HCC) [F12.20] 08/01/2015  . Amputation of arm below elbow, left (HCC) [Z61.096E[S58.112A, A54.098J]S68.412A] 08/01/2015  . Opioid use disorder, severe, dependence (HCC) [F11.20] 08/01/2015  . Schizoaffective disorder, bipolar type (HCC) [F25.0] 07/31/2015  . Grief reaction [F43.21] 03/09/2015  . Dysuria [R30.0] 10/09/2014  . Encounter for long-term (current) use of medications [Z79.899] 06/27/2013  . Screening examination for venereal disease [Z11.3] 06/27/2013  . ESSENTIAL HYPERTENSION, BENIGN [I10] 05/18/2008  . BACK STRAIN, LUMBAR [S33.5XXA] 11/09/2007  . MENOPAUSE-RELATED VASOMOTOR SYMPTOMS, HOT FLASHES [N95.1] 08/24/2006  . Human immunodeficiency virus (HIV) disease (HCC) [B20] 05/06/2006  . HERPES ZOSTER [B02.9] 05/06/2006    Total Time spent with patient: 30 minutes  Musculoskeletal: Strength & Muscle Tone: within normal limits Gait & Station: normal Patient leans: N/A  Psychiatric Specialty Exam: Review of Systems  Psychiatric/Behavioral: Positive for substance abuse. Negative for depression and suicidal ideas.  All other systems reviewed and are negative.   Blood pressure 120/80, pulse 80, temperature 98.4 F (36.9 C), temperature source Oral, resp. rate 18, height 5\' 4"  (1.626 m), weight 109.77 kg (242 lb), last menstrual period 07/11/2015, SpO2 98 %.Body mass index is 41.52 kg/(m^2).  General Appearance: Casual  Eye Contact::  Fair  Speech:  Clear and Coherent409  Volume:  Normal  Mood:  Euthymic  Affect:  Appropriate  Thought Process:  Goal Directed and Descriptions of Associations: Intact  Orientation:  Full (Time, Place, and Person)  Thought  Content:  Logical  Suicidal Thoughts:  No  Homicidal Thoughts:  No  Memory:  Immediate;   Fair Recent;   Fair Remote;   Fair  Judgement:  Fair  Insight:  Fair  Psychomotor Activity:  Normal  Concentration:  Fair  Recall:  [employer]Fair  Fund of Knowledge:Fair  Language: Fair  Akathisia:  No  Handed:  Right  AIMS (if indicated):     Assets:  Communication Skills Desire for Improvement  Sleep:  Number of Hours: 5  Cognition: WNL  ADL's:  Intact   Mental Status Per Nursing Assessment::   On Admission:  Suicidal ideation indicated by patient  Demographic Factors:  NA  Loss Factors: Decline in physical health  Historical Factors: Impulsivity  Risk Reduction Factors:   Positive social support  Continued Clinical Symptoms:  Alcohol/Substance Abuse/Dependencies Previous Psychiatric Diagnoses and Treatments Medical Diagnoses and Treatments/Surgeries  Cognitive Features That Contribute To Risk:  None    Suicide Risk:  Minimal: No identifiable suicidal ideation.  Patients presenting with no risk factors but with morbid ruminations; may be classified as minimal risk based on the severity of the depressive symptoms  Follow-up Information    Follow up with [facility] .   Why:  Per Fannie KneeSue on ACT team, and ACT member will pick you up at approximately 1:30PM today at discharge.    Contact information:   [address] [place], [place] [address] Phone: [phone] Fax: [phone]      Follow [facility] with [facility] ([facility]   Why:  Walk in hours Monday, Wednesday, Friday between 9am-11:30am for assessment (Substance Abuse Intensive Outpatient Group). Please bring Medicaid card and photo ID. Thank you.    Contact information:   [address] [place],  [place] [address] Phone: [phone] Fax: [phone]      Plan Of Care/Follow-up recommendations:  Activity:  no restrictions Diet:  regular Tests:  Please follow up with out patient provider to  monitor your EKG for qtc prolongation. Other:  none  Kayline Sheer, MD 08/02/2015, 9:22 AM

## 2015-08-02 NOTE — Discharge Summary (Signed)
Physician Discharge Summary Note  Patient:  [name] is an 50 y.o., female MRN:  [id number] DOB:  08-31-1965 Patient phone:  [phone] (home)  Patient address:   [address] [place] [address],  Total Time spent with patient: Greater than 30 minutes  Date of Admission:  07/31/2015  Date of Discharge: 08-02-15  Reason for Admission: Worsening symptoms of Schizoaffective disorder.  Principal Problem: Schizoaffective disorder, bipolar type [facility](HCC)  Discharge Diagnoses: Patient Active Problem List   Diagnosis Date Noted  . Opioid use disorder, severe, dependence (HCC) [F11.20] 08/01/2015    Priority: High  . Schizoaffective disorder, bipolar type (HCC) [F25.0] 07/31/2015    Priority: Medium  . Amputation of arm below elbow, left [facility](HCC) [B28.413K[S58.112A, G40.102V]S68.412A] 08/01/2015    Priority: Low  . Opioid use disorder, moderate, dependence (HCC) [F11.20] 08/01/2015  . Benzodiazepine abuse [F13.10] 08/01/2015  . Cannabis use disorder, moderate, dependence (HCC) [F12.20] 08/01/2015  . Grief reaction [F43.21] 03/09/2015  . Dysuria [R30.0] 10/09/2014  . Encounter for long-term (current) use of medications [Z79.899] 06/27/2013  . Screening examination for venereal disease [Z11.3] 06/27/2013  . ESSENTIAL HYPERTENSION, BENIGN [I10] 05/18/2008  . BACK STRAIN, LUMBAR [S33.5XXA] 11/09/2007  . MENOPAUSE-RELATED VASOMOTOR SYMPTOMS, HOT FLASHES [N95.1] 08/24/2006  . Human immunodeficiency virus (HIV) disease (HCC) [B20] 05/06/2006  . HERPES ZOSTER [B02.9] 05/06/2006   Past Psychiatric History: Schizoaffective disorder  Past Medical History:  Past Medical History  Diagnosis Date  . Bipolar disorder (HCC)   . Schizoaffective disorder   . Suicide and self-inflicted injury (HCC)   . Hypertension   . Traumatic brain injury (HCC)   . Substance abuse   . HIV (human immunodeficiency virus infection) [facility](HCC)     Past Surgical History  Procedure Laterality Date  . Arm amputation at  elbow    . Bowel obstruction    . Surgery r/t bowel obstruction     Family History:  Family History  Problem Relation Age of Onset  . Diabetes Mother   . Mental illness Neg Hx    Family Psychiatric  History: See H&P  Social History:  History  Alcohol Use No    Comment: 2-3 beers     History  Drug Use No    Social History   Social History  . Marital Status: Single    Spouse Name: N/A  . Number of Children: N/A  . Years of Education: N/A   Social History Main Topics  . Smoking status: Current Every Day Smoker -- 0.40 packs/day for 30 years    Types: Cigarettes  . Smokeless tobacco: Never Used     Comment: trying to cut back  . Alcohol Use: No     Comment: 2-3 beers  . Drug Use: No  . Sexual Activity:    Partners: Male     Comment: patient given condoms   Other Topics Concern  . None   Social History Narrative   Hospital Course: This is an admission assessment for this 50 year old African-American female with hx of Schizoaffective disorder, bipolar-type. [facility] with complaints of suicidal ideations, auditory, visual hallucinations & opioid withdrawal symptoms. She is here for opioid detoxification as well as mood stabilization treatments. During this admission assessment, [name] reports, "My uncle took me to [facility] ED yesterday because I was feeling very depressed, hearing voices, seeing things & I felt like hurting myself. I did not try to hurt myself, but I  had in the past. I just can't remember what I did back then. I have been feeling very depressed since my mother's legs were amputated 3 months ago. She has diabetes. But, I have been using drugs x 4 months due to my bad depression. I have spent $4,000.00 on heroin alone in 4 months. I feel really sad for the kind of life I've been living. I was diagnosed with HIV in 2005. I'm not on medications yet because my doctor told me that my T-cells are still  high. I have Bipolar & Schizophrenia. I'm on medications for it. I see Dr. Kyla BalzarineGiovani, he is with my Act Team. I'm feeling a lot better now. Can I be discharged tomorrow?  [name]'s stay in this hospital was rather very brief. She was admitted to the hospital with a BAL of <5 & UDS positive for Benzodiazepine, Opioid & THC. She was also complaining of opioid withdrawal symptoms, suicidal ideations & AVH. She admitted having been using drugs for the last 4 months. After her admission assessment, she was started on Clonidine detoxification treatment protocols & Librium 25 mg on a prn basis for possible Benzodiazepine withdrawal symptoms. Her other pertinent psychiatric medications were re-started.  However, [name] never did develop substance withdrawal symptoms from the time of her arrival to the unit to when she was evaluated/discharged. During her admission evaluation, [name] asked if she could be discharged the following day as she felt she was starting to feel better.    During the follow-up care assessment this morning, [name] presented with a good affect, good eye contact, is alert & oriented x 3. She is aware of situation & able to make concrete decisions/requests. She presented no significant pre-existing medical issues that required treatment or monitoring. She has asked to be discharged today. She mentally & medically stable. And because there is no clinical criteria to [name] hospital, she is being discharged as requested to her place of residence. She is committed to abstaining from substances.   Upon discharge, she appears much more in control of her mood & behavior. Her symptoms were reported as significantly improved or completely resolved There are currently, no active SI plans or intent, AVH, delusional thoughts or paranoia. She is going to pursue outpatient treatment in her own terms with her Act Team as noted below. She was provided with prescriptions. [facility]  personal belongings in no apparent distress. Transportation per her Act team. Physical Findings: AIMS: Facial and Oral Movements Muscles of Facial Expression: None, normal Lips and Perioral Area: None, normal Jaw: None, normal Tongue: None, normal,Extremity Movements Upper (arms, wrists, hands, fingers): None, normal Lower (legs, knees, ankles, toes): None, normal, Trunk Movements Neck, shoulders, hips: None, normal, Overall Severity Severity of abnormal movements (highest score from questions above): None, normal Incapacitation due to abnormal movements: None, normal Patient's awareness of abnormal movements (rate only patient's report): No Awareness, Dental Status Current problems with teeth and/or dentures?: No Does patient usually wear dentures?: No  CIWA:  CIWA-Ar Total: 5 COWS:  COWS Total Score: 0  Musculoskeletal: Strength & Muscle Tone: within normal limits Gait & Station: normal Patient leans: N/A  Psychiatric Specialty Exam: Physical Exam  Constitutional: She is oriented to person, place, and time. She appears well-developed and well-nourished.  HENT:  Head: Normocephalic.  Eyes: Pupils are equal, round, and reactive to light.  Neck: Normal range of motion.  Cardiovascular: Normal rate.   Respiratory: Effort normal.  GI: Soft.  Genitourinary:  Denies any issues  Musculoskeletal: Normal range of motion.  Neurological: She is alert and oriented to person, place, and time.  Skin: Skin is warm and dry.    Review of Systems  Constitutional: Negative.   HENT: Negative.   Eyes: Negative.   Respiratory: Negative.   Cardiovascular: Negative.   Gastrointestinal: Negative.   Genitourinary: Negative.   Musculoskeletal: Negative.   Skin: Negative.   Neurological: Negative.   Endo/Heme/Allergies: Negative.   Psychiatric/Behavioral: Positive for depression (Stable), hallucinations (Hx of auditory hallucinations) and substance abuse (Opioid use disorder). Negative for  suicidal ideas and memory loss. The patient has insomnia (Stable). The patient is not nervous/anxious.     Blood pressure 116/84, pulse 85, temperature 98.4 F (36.9 C), temperature source Oral, resp. rate 18, height  (1.626 m), weight 109.77 kg (242 lb), last menstrual period 07/11/2015, SpO2 98 %.Body mass index is 41.52 kg/(m^2).  See Md's SRA   Have you used any form of tobacco in the last 30 days? (Cigarettes, Smokeless Tobacco, Cigars, and/or Pipes): Yes  Has this patient used any form of tobacco in the last 30 days? (Cigarettes, Smokeless Tobacco, Cigars, and/or Pipes) Yes, Yes, A prescription for an FDA-approved tobacco cessation medication was offered at discharge and the patient refused  Blood Alcohol level:  Lab Results  Component Value Date   [facility] <5 07/31/2015   ETH <11 01/29/2012   Metabolic Disorder Labs:  No results found for: HGBA1C, MPG No results found for: PROLACTIN Lab Results  Component Value Date   CHOL 159 02/22/2015   TRIG 79 02/22/2015   HDL 52 02/22/2015   CHOLHDL 3.1 02/22/2015   VLDL 16 02/22/2015   LDLCALC 91 02/22/2015   LDLCALC 112* 12/14/2013   See Psychiatric Specialty Exam and Suicide Risk Assessment completed by Attending Physician prior to discharge.  Discharge destination:  Home  Is patient on multiple antipsychotic therapies at discharge:  No   Has Patient had three or more failed trials of antipsychotic monotherapy by history:  No  Recommended Plan for Multiple Antipsychotic Therapies: NA    Medication List    TAKE these medications      Indication   FLUoxetine 20 MG capsule  Commonly known as:  PROZAC  Take 1 capsule (20 mg total) by mouth daily. For depression.   Indication:  Major Depressive Disorder     hydrOXYzine 25 MG tablet  Commonly known as:  ATARAX/VISTARIL  Take 1 tablet (25 mg total) by mouth every 6 (six) hours as needed for anxiety.   Indication:  Anxiety Neurosis     nicotine 14 mg/24hr patch  Commonly  known as:  NICODERM CQ - dosed in mg/24 hours  Place 1 patch (14 mg total) onto the skin daily. For smoking cessation   Indication:  Nicotine Addiction     QUEtiapine 200 MG tablet  Commonly known as:  SEROQUEL  Take 1 tablet (200 mg total) by mouth 4 (four) times daily. For mood control   Indication:  Mood control     traZODone 100 MG tablet  Commonly known as:  DESYREL  Take 1 tablet (100 mg total) by mouth at bedtime. For sleep   Indication:  Trouble Sleeping       Follow-up Information    Follow up with [facility] .   Why:  Per Fannie Knee on ACT team, and ACT member will pick you up at approximately 1:30PM today at discharge.    Contact information:   [address] [place], [place] [address] Phone: [phone] Fax: [phone]      Follow [facility] with [facility]   Why:  Walk in hours Monday, Wednesday, Friday between 9am-11:30am for assessment (Substance Abuse Intensive Outpatient Group). Please bring Medicaid card and photo ID. Please check with ACT team to find out if you can be seen for SAIOP at this agency.    Contact information:   [address] [place], [place] [address] Phone: [phone] Fax: [phone]     Follow-up recommendations: Activity:  As tolerated Diet: As recommended by your primary care doctor. Keep all scheduled follow-up appointments as recommended.   Comments: Patient is instructed prior to discharge to: Take all medications as prescribed by his/her mental healthcare provider. Report any adverse effects and or reactions from the medicines to his/her outpatient provider promptly. Patient has been instructed & cautioned: To not engage in alcohol and or illegal drug use while on prescription medicines. In the event of worsening symptoms, patient is instructed to call the crisis hotline, 911 and or go to the nearest ED for appropriate evaluation and treatment of symptoms. To follow-up with his/her primary care  provider for your other medical issues, concerns and or health care needs.   Signed: Sanjuana Kava, NP, PMHMNP, FNP-BC 08/02/2015, 3:08 PM

## 2015-08-10 ENCOUNTER — Emergency Department (HOSPITAL_COMMUNITY)
Admission: EM | Admit: 2015-08-10 | Discharge: 2015-08-10 | Disposition: A | Discharge status: Home / Self Care | Payer: Medicaid Other | Attending: Emergency Medicine | Admitting: Emergency Medicine

## 2015-08-10 ENCOUNTER — Encounter (HOSPITAL_COMMUNITY): Payer: Self-pay | Admitting: Emergency Medicine

## 2015-08-10 DIAGNOSIS — I10 Essential (primary) hypertension: Secondary | ICD-10-CM | POA: Diagnosis not present

## 2015-08-10 DIAGNOSIS — F17210 Nicotine dependence, cigarettes, uncomplicated: Secondary | ICD-10-CM | POA: Insufficient documentation

## 2015-08-10 DIAGNOSIS — Z79899 Other long term (current) drug therapy: Secondary | ICD-10-CM | POA: Diagnosis not present

## 2015-08-10 DIAGNOSIS — R102 Pelvic and perineal pain: Secondary | ICD-10-CM | POA: Diagnosis present

## 2015-08-10 DIAGNOSIS — R1032 Left lower quadrant pain: Secondary | ICD-10-CM | POA: Insufficient documentation

## 2015-08-10 DIAGNOSIS — N730 Acute parametritis and pelvic cellulitis: Secondary | ICD-10-CM

## 2015-08-10 LAB — WET PREP, GENITAL
Sperm: NONE SEEN
Trich, Wet Prep: NONE SEEN
Yeast Wet Prep HPF POC: NONE SEEN

## 2015-08-10 LAB — URINALYSIS, ROUTINE W REFLEX MICROSCOPIC
BILIRUBIN URINE: NEGATIVE
Glucose, UA: NEGATIVE mg/dL
Hgb urine dipstick: NEGATIVE
KETONES UR: NEGATIVE mg/dL
Leukocytes, UA: NEGATIVE
NITRITE: NEGATIVE
PROTEIN: NEGATIVE mg/dL
SPECIFIC GRAVITY, URINE: 1.024 (ref 1.005–1.030)
pH: 5.5 (ref 5.0–8.0)

## 2015-08-10 LAB — PREGNANCY, URINE: PREG TEST UR: NEGATIVE

## 2015-08-10 MED ORDER — TRAMADOL HCL 50 MG PO TABS: 100.0000 mg | ORAL_TABLET | Freq: Two times a day (BID) | ORAL | Status: AC | PRN

## 2015-08-10 MED ORDER — DOXYCYCLINE HYCLATE 100 MG PO CAPS: 100.0000 mg | ORAL_CAPSULE | Freq: Two times a day (BID) | ORAL | Status: DC

## 2015-08-10 MED ORDER — HYDROCODONE-ACETAMINOPHEN 5-325 MG PO TABS
1.0000 | ORAL_TABLET | Freq: Once | ORAL | Status: AC
  Administered 2015-08-10: 1 via ORAL
  Filled 2015-08-10: qty 1

## 2015-08-10 MED ORDER — AZITHROMYCIN 250 MG PO TABS
1000.0000 mg | ORAL_TABLET | Freq: Once | ORAL | Status: AC
  Administered 2015-08-10: 1000 mg via ORAL
  Filled 2015-08-10: qty 4

## 2015-08-10 MED ORDER — CEFTRIAXONE SODIUM 250 MG IJ SOLR
250.0000 mg | Freq: Once | INTRAMUSCULAR | Status: AC
  Administered 2015-08-10: 250 mg via INTRAMUSCULAR
  Filled 2015-08-10: qty 250

## 2015-08-10 NOTE — ED Notes (Signed)
Pt arrives with c/o of L abdominal pain - states she has an ovarian cyst. No OBGYN, PCP. LMP Wednesday. Discharge, no bleeding. Denies N/V.

## 2015-08-10 NOTE — ED Provider Notes (Signed)
CSN: [id number]     Arrival date & time 08/10/15  4098 History   First MD Initiated Contact with Patient 08/10/15 [phone]     Chief Complaint  Patient presents with  . Ovarian Cyst     (Consider location/radiation/quality/duration/timing/severity/associated sxs/prior Treatment) HPI Comments: Pt comes in with cc of pelvic pain. Pt c/o LLQ pain that started 2 days ago. Pain is constant, sharp and moderately severe. Pt has hx of ovarian cyst, and concerned the pain might be cyst related. She has vaginal discharge x 1 week, yellow, foul smelling. Pt is having intercourse, unprotected. She has had hx of SBO. Passing flatus right now and has normal BM. No hxo f diverticular dz, and the last CT scan from a year ago didn't show any diverticular dz.   ROS 10 Systems reviewed and are negative for acute change except as noted in the HPI.     The history is provided by the patient.    Past Medical History  Diagnosis Date  . Bipolar disorder (HCC)   . Schizoaffective disorder   . Suicide and self-inflicted injury (HCC)   . Hypertension   . Traumatic brain injury (HCC)   . Substance abuse   . HIV (human immunodeficiency virus infection) [facility]    Past Surgical History  Procedure Laterality Date  . 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 Bowel obstruction    . Surgery r/t bowel obstruction     Family History  Problem Relation Age of Onset  . Diabetes Mother   . Mental illness Neg Hx    Social History  Substance Use Topics  . Smoking status: Current Every Day Smoker -- 0.40 packs/day for 30 years    Types: Cigarettes  . Smokeless tobacco: Never Used     Comment: trying to cut back  . Alcohol Use: No     Comment: 2-3 beers   OB History    No data available     Review of Systems    Allergies  Ibuprofen; Lithium; Risperidone and related; and Thorazine  Home Medications   Prior to Admission medications   Medication Sig Start Date End Date Taking? Authorizing Provider  FLUoxetine  (PROZAC) 20 MG capsule Take 1 capsule (20 mg total) by mouth daily. For depression. 08/02/15  Yes Sanjuana Kava, NP  hydrOXYzine (ATARAX/VISTARIL) 25 MG tablet Take 1 tablet (25 mg total) by mouth every 6 (six) hours as needed for anxiety. 08/02/15  Yes Sanjuana Kava, NP  QUEtiapine (SEROQUEL) 200 MG tablet Take 1 tablet (200 mg total) by mouth 4 (four) times daily. For mood control 08/02/15  Yes Sanjuana Kava, NP  traZODone (DESYREL) 100 MG tablet Take 1 tablet (100 mg total) by mouth at bedtime. For sleep 08/02/15  Yes Sanjuana Kava, NP  doxycycline (VIBRAMYCIN) 100 MG capsule Take 1 capsule (100 mg total) by mouth 2 (two) times daily. 08/10/15   Derwood Kaplan, MD  nicotine (NICODERM CQ - DOSED IN MG/24 HOURS) 14 mg/24hr patch Place 1 patch (14 mg total) onto the skin daily. For smoking cessation Patient not taking: Reported on 08/10/2015 08/02/15   Sanjuana Kava, NP  traMADol (ULTRAM) 50 MG tablet Take 2 tablets (100 mg total) by mouth every 12 (twelve) hours as needed for severe pain. 08/10/15   Antanasia Kaczynski Rhunette Croft, MD   BP 100/62 mmHg  Pulse 89  Temp(Src) 98.2 F (36.8 C) (Oral)  Resp 22  Ht  (1.626 m)  SpO2 96%  LMP 08/07/2015 (  Approximate) Physical Exam  Constitutional: She is oriented to person, place, and time. She appears well-developed.  HENT:  Head: Normocephalic and atraumatic.  Eyes: Conjunctivae and EOM are normal. Pupils are equal, round, and reactive to light.  Neck: Normal range of motion. Neck supple.  Cardiovascular: Normal rate, regular rhythm, normal heart sounds and intact distal pulses.   No murmur heard. Pulmonary/Chest: Effort normal. No respiratory distress. She has no wheezes.  Abdominal: Soft. Bowel sounds are normal. She exhibits no distension. There is tenderness. There is guarding. There is no rebound.  Voluntary guarding  Genitourinary: Vagina normal and uterus normal.  External exam - normal, no lesions Speculum exam: Pt has thick yellow discharge, no  blood Bimanual exam: Patient has CMT, no adnexal tenderness or fullness and cervical os is closed  Neurological: She is alert and oriented to person, place, and time.  Skin: Skin is warm and dry.  Nursing note and vitals reviewed.   ED Course  Procedures (including critical care time) Labs Review Labs Reviewed  WET PREP, GENITAL - Abnormal; Notable for the following:    Clue Cells Wet Prep HPF POC PRESENT (*)    WBC, Wet Prep HPF POC MODERATE (*)    All other components within normal limits  URINALYSIS, ROUTINE W REFLEX MICROSCOPIC (NOT AT [facility]) - Abnormal; Notable for the following:    APPearance HAZY (*)    All other components within normal limits  PREGNANCY, URINE  GC/CHLAMYDIA PROBE AMP ([place]) NOT AT [facility]    Imaging Review No results found. I have personally reviewed and evaluated these images and lab results as part of my medical decision-making.   EKG Interpretation None      MDM   Final diagnoses:  Pelvic pain in female  LLQ pain  PID (acute pelvic inflammatory disease)    Based on exam, clinical concerns for PID and we will tx accordingly. Diverticulitis, stones considered in the ddx as well, but are less likely. Strict ER return precautions have been discussed, and patient is agreeing with the plan and is comfortable with the workup done and the recommendations from the ER.    Derwood KaplanAnkit Yaniv Lage, MD 08/10/15 [phone]

## 2015-08-10 NOTE — Discharge Instructions (Signed)

## 2015-08-12 ENCOUNTER — Other Ambulatory Visit: Payer: Medicaid Other

## 2015-08-12 DIAGNOSIS — B20 Human immunodeficiency virus [HIV] disease: Secondary | ICD-10-CM

## 2015-08-12 LAB — GC/CHLAMYDIA PROBE AMP (~~LOC~~) NOT AT ARMC
CHLAMYDIA, DNA PROBE: NEGATIVE
NEISSERIA GONORRHEA: NEGATIVE

## 2015-08-13 LAB — T-HELPER CELL (CD4) - (RCID CLINIC ONLY)
CD4 % Helper T Cell: 41 % (ref 33–55)
CD4 T CELL ABS: 1060 /uL (ref 400–2700)

## 2015-08-14 LAB — HIV-1 RNA QUANT-NO REFLEX-BLD
HIV 1 RNA QUANT: 54 {copies}/mL — AB (ref <20)
HIV-1 RNA Quant, Log: 1.73 Log copies/mL — ABNORMAL HIGH (ref <1.30)

## 2015-08-28 ENCOUNTER — Ambulatory Visit: Payer: Self-pay | Admitting: Internal Medicine

## 2015-09-17 ENCOUNTER — Encounter: Payer: Self-pay | Admitting: Internal Medicine

## 2015-09-17 ENCOUNTER — Ambulatory Visit (INDEPENDENT_AMBULATORY_CARE_PROVIDER_SITE_OTHER): Payer: Medicaid Other | Admitting: Internal Medicine

## 2015-09-17 VITALS — BP 115/81 | HR 89 | Temp 98.8°F | Wt 193.0 lb

## 2015-09-17 DIAGNOSIS — R634 Abnormal weight loss: Secondary | ICD-10-CM | POA: Insufficient documentation

## 2015-09-17 DIAGNOSIS — Z113 Encounter for screening for infections with a predominantly sexual mode of transmission: Secondary | ICD-10-CM | POA: Diagnosis present

## 2015-09-17 DIAGNOSIS — B20 Human immunodeficiency virus [HIV] disease: Secondary | ICD-10-CM | POA: Diagnosis not present

## 2015-09-17 NOTE — Assessment & Plan Note (Signed)
No concerning signs, not eating with mental health issues.  No indication for appetite stimulant at this time.

## 2015-09-17 NOTE — Assessment & Plan Note (Addendum)
I encouraged starting treatment but not interested at this time.  I again discussed risks of active virus to various organs, cancer.   Will follow CD4, viral load every 6 months.  No indication for cmp, cbc, lipid panel without medication.  Can be followed by PCP as needed.

## 2015-09-17 NOTE — Progress Notes (Signed)
  Subjective:    Patient ID: [name], female    DOB: 01/27/1966, 50 y.o.   MRN: [id number]  HPI She comes in for routine follow up for HIV.   She has been a long term non progressor and not interested in treatment. She briefly start Stribild but stopped after not wanting to take it anymore.  No weight loss, no diarrhea.   Her CD4 is 1060 and viral load 54.  She [facility] for worsening schizoaffective disorder, following with psychiatry still.  Seeing her PCP today.  Asking for something for an appetite stimulate with some recent weight loss. Not eating much with depression but feels better overall.    Review of Systems  Constitutional: Negative for appetite change and fatigue.  HENT: Negative for sore throat and trouble swallowing.   Gastrointestinal: Negative for diarrhea.  Skin: Negative for rash.  Neurological: Negative for dizziness and headaches.  Psychiatric/Behavioral: Positive for dysphoric mood.       Objective:   Physical Exam  Constitutional: She appears well-developed and well-nourished. No distress.  HENT:  Mouth/Throat: No oropharyngeal exudate.  Eyes: No scleral icterus.  Cardiovascular: Normal rate, regular rhythm and normal heart sounds.   No murmur heard. Pulmonary/Chest: Effort normal and breath sounds normal. No respiratory distress.  Lymphadenopathy:    She has no cervical adenopathy.  Skin: No rash noted.          Assessment & Plan:

## 2015-09-25 ENCOUNTER — Ambulatory Visit: Payer: Self-pay | Admitting: *Deleted

## 2015-10-27 DEATH — deceased

## 2016-03-05 ENCOUNTER — Other Ambulatory Visit: Payer: Self-pay

## 2016-03-19 ENCOUNTER — Ambulatory Visit: Payer: Self-pay | Admitting: Internal Medicine
